# Patient Record
Sex: Male | Born: 1990
Health system: Southern US, Community
[De-identification: ages and names within clinical notes are randomized; demographics above are authoritative.]

## PROBLEM LIST (undated history)

## (undated) ENCOUNTER — Ambulatory Visit (HOSPITAL_COMMUNITY): Admission: EM | Payer: BC Managed Care – PPO | Source: Home / Self Care

## (undated) DIAGNOSIS — K219 Gastro-esophageal reflux disease without esophagitis: Secondary | ICD-10-CM

## (undated) DIAGNOSIS — R002 Palpitations: Secondary | ICD-10-CM

## (undated) DIAGNOSIS — R51 Headache: Secondary | ICD-10-CM

## (undated) DIAGNOSIS — R519 Headache, unspecified: Secondary | ICD-10-CM

## (undated) DIAGNOSIS — R079 Chest pain, unspecified: Secondary | ICD-10-CM

## (undated) DIAGNOSIS — R0602 Shortness of breath: Secondary | ICD-10-CM

## (undated) HISTORY — DX: Gastro-esophageal reflux disease without esophagitis: K21.9

## (undated) HISTORY — PX: NO PAST SURGERIES: SHX2092

## (undated) HISTORY — DX: Headache, unspecified: R51.9

## (undated) HISTORY — DX: Chest pain, unspecified: R07.9

## (undated) HISTORY — DX: Shortness of breath: R06.02

## (undated) HISTORY — DX: Headache: R51

## (undated) HISTORY — DX: Palpitations: R00.2

---
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
## 2009-02-13 ENCOUNTER — Emergency Department (HOSPITAL_COMMUNITY): Admission: EM | Admit: 2009-02-13 | Discharge: 2009-02-14 | Payer: Self-pay | Admitting: Emergency Medicine

## 2009-02-14 ENCOUNTER — Emergency Department (HOSPITAL_COMMUNITY): Admission: EM | Admit: 2009-02-14 | Discharge: 2009-02-14 | Payer: Self-pay | Admitting: Emergency Medicine

## 2009-02-19 ENCOUNTER — Emergency Department (HOSPITAL_COMMUNITY): Admission: EM | Admit: 2009-02-19 | Discharge: 2009-02-19 | Payer: Self-pay | Admitting: Family Medicine

## 2009-08-15 ENCOUNTER — Emergency Department (HOSPITAL_COMMUNITY): Admission: EM | Admit: 2009-08-15 | Discharge: 2009-08-15 | Payer: Self-pay | Admitting: Emergency Medicine

## 2009-12-08 ENCOUNTER — Emergency Department (HOSPITAL_COMMUNITY): Admission: EM | Admit: 2009-12-08 | Discharge: 2009-12-08 | Payer: Self-pay | Admitting: Family Medicine

## 2010-10-05 ENCOUNTER — Ambulatory Visit: Payer: Self-pay | Admitting: Internal Medicine

## 2010-10-12 ENCOUNTER — Ambulatory Visit: Payer: Self-pay | Admitting: Internal Medicine

## 2011-01-30 ENCOUNTER — Emergency Department (HOSPITAL_COMMUNITY)
Admission: EM | Admit: 2011-01-30 | Discharge: 2011-01-30 | Disposition: A | Discharge status: Home / Self Care | Payer: 59 | Attending: Emergency Medicine | Admitting: Emergency Medicine

## 2011-01-30 ENCOUNTER — Emergency Department (HOSPITAL_COMMUNITY): Payer: 59

## 2011-01-30 DIAGNOSIS — R51 Headache: Secondary | ICD-10-CM | POA: Insufficient documentation

## 2011-01-30 DIAGNOSIS — IMO0002 Reserved for concepts with insufficient information to code with codable children: Secondary | ICD-10-CM | POA: Insufficient documentation

## 2011-01-30 DIAGNOSIS — S0990XA Unspecified injury of head, initial encounter: Secondary | ICD-10-CM | POA: Insufficient documentation

## 2011-04-11 ENCOUNTER — Emergency Department (HOSPITAL_COMMUNITY)
Admission: EM | Admit: 2011-04-11 | Discharge: 2011-04-11 | Discharge status: Against Medical Advice | Payer: 59 | Attending: Emergency Medicine | Admitting: Emergency Medicine

## 2011-04-11 ENCOUNTER — Encounter: Payer: Self-pay | Admitting: *Deleted

## 2011-04-11 DIAGNOSIS — L988 Other specified disorders of the skin and subcutaneous tissue: Secondary | ICD-10-CM | POA: Insufficient documentation

## 2011-04-11 NOTE — ED Notes (Signed)
Patient states that he is here tonight for evaluation of skin irritation and bumps that began 5 months ago but have gotten worse over time. Patient states that he has been to the Dermatologist for treatment but without any relief. Patient has been diagnosed with Eczema but has not received any relief from the cream that the dermatologist has prescribed. Patient has small raised papules on his wrist and inner arms that he states cover most of his body. He states that the itching has become very intense and started to burn in the shower.

## 2011-04-12 ENCOUNTER — Encounter (HOSPITAL_COMMUNITY): Payer: Self-pay | Admitting: *Deleted

## 2011-04-12 ENCOUNTER — Emergency Department (HOSPITAL_COMMUNITY)
Admission: EM | Admit: 2011-04-12 | Discharge: 2011-04-12 | Disposition: A | Discharge status: Home / Self Care | Payer: 59 | Attending: Emergency Medicine | Admitting: Emergency Medicine

## 2011-04-12 DIAGNOSIS — F17200 Nicotine dependence, unspecified, uncomplicated: Secondary | ICD-10-CM | POA: Insufficient documentation

## 2011-04-12 DIAGNOSIS — R21 Rash and other nonspecific skin eruption: Secondary | ICD-10-CM | POA: Insufficient documentation

## 2011-04-12 MED ORDER — PREDNISONE (PAK) 10 MG PO TABS: ORAL_TABLET | ORAL | Status: AC

## 2011-04-12 MED ORDER — HYDROXYZINE HCL 25 MG PO TABS: 25.0000 mg | ORAL_TABLET | Freq: Four times a day (QID) | ORAL | Status: AC

## 2011-04-12 NOTE — Discharge Instructions (Signed)
Rash, Generic Many things can cause a rash. We are not certain what is causing the rash that you have. Some causes include infection, allergic reactions, medications, and chemicals. Sometimes something in your home that comes in contact with your skin may cause the rash. These include pets, new soaps, cosmetics, and foods. HOME CARE INSTRUCTIONS   Avoid extreme heat or cold, unless otherwise instructed. This can make the itching worse.   A cool bath or shower or a cool washcloth can sometimes ease the itching.   Avoid scratching. This can cause infection.   Take those medications prescribed by your caregiver.  SEEK IMMEDIATE MEDICAL CARE IF:  You develop increasing pain, swelling, or redness.   You develop a fever.   You develop new or severe symptoms such as body aches and pains, diarrhea, vomiting.   Your rash is not better in 3 days.  Document Released: 04/29/2002 Document Revised: 01/19/2011 Document Reviewed: 07/04/2008 [facility] Patient Information 2012 [place], [place].

## 2011-04-12 NOTE — ED Provider Notes (Signed)
Medical screening examination/treatment/procedure(s) were performed by non-physician practitioner and as supervising physician I was immediately available for consultation/collaboration.  Jasmine Awe, MD 04/12/11 2244

## 2011-04-12 NOTE — ED Notes (Signed)
Pt reports small raised red bumps on body, worse on back and thighs x5 months. Describes as very itchy. Was triaged yesterday but left due to wait. Has been seen here before for same. Has not f/u with dermatology.

## 2011-04-12 NOTE — ED Provider Notes (Signed)
History     CSN: [id number] Arrival date & time: 04/12/2011  6:16 PM  9:02 PM HPI Patient reports had a rash for 5 months. Uncertain if he has had a change in soaps, shampoos, lotions, laundry shampoo. No body that is close to him, ie girlfriend, family or friends, has similar rash. Reports rash is extremely pruritic. Denies fever, headache, neck pain, neck stiffness, purulent drainage. Patient is a 20 y.o. male presenting with rash. The history is provided by the patient.  Rash  Episode onset: 5 months ago. The problem has been gradually worsening. The problem is associated with an unknown factor. There has been no fever. The rash is present on the torso, left arm, right arm and abdomen. The pain is at a severity of 0/10. The patient is experiencing no pain. The pain has been constant since onset. Associated symptoms include itching. He has tried antihistamines for the symptoms. The treatment provided no relief.    History reviewed. No pertinent past medical history.  History reviewed. No pertinent past surgical history.  No family history on file.  History  Substance Use Topics  . Smoking status: Current Everyday Smoker -- 1.0 packs/day for 3 years    Types: Cigars  . Smokeless tobacco: Not on file  . Alcohol Use: Yes     occasionally      Review of Systems  Constitutional: Negative for fever and chills.  HENT: Negative for neck pain and neck stiffness.   Respiratory: Negative for cough and shortness of breath.   Skin: Positive for itching and rash. Negative for color change.  Neurological: Negative for headaches.    Allergies  Review of patient's allergies indicates no known allergies.  Home Medications  No current outpatient prescriptions on file.  BP 122/58  Pulse 59  Temp(Src) 98.3 F (36.8 C) (Oral)  Resp 18  Ht 5\' 7"  (1.702 m)  Wt 143 lb (64.864 kg)  BMI 22.40 kg/m2  SpO2 100%  Physical Exam  Constitutional: He is oriented to person, place, and time. He  appears well-developed and well-nourished.  HENT:  Head: Normocephalic and atraumatic.  Eyes: Pupils are equal, round, and reactive to light.  Neurological: He is alert and oriented to person, place, and time.  Skin: Skin is warm and dry. No rash noted. No erythema. No pallor.       Maculopapular rash on bilateral upper chimneys and lower extremities, entire torso and back. Rash is erythematous and dry.   Psychiatric: He has a normal mood and affect. His behavior is normal.    ED Course  Procedures    MDM          Thomasene Lot, [place] 04/12/11 2108

## 2011-04-26 ENCOUNTER — Encounter (HOSPITAL_COMMUNITY): Payer: Self-pay | Admitting: *Deleted

## 2011-04-26 ENCOUNTER — Emergency Department (HOSPITAL_COMMUNITY)
Admission: EM | Admit: 2011-04-26 | Discharge: 2011-04-26 | Disposition: A | Discharge status: Home / Self Care | Payer: 59 | Attending: Emergency Medicine | Admitting: Emergency Medicine

## 2011-04-26 DIAGNOSIS — R21 Rash and other nonspecific skin eruption: Secondary | ICD-10-CM

## 2011-04-26 DIAGNOSIS — L299 Pruritus, unspecified: Secondary | ICD-10-CM | POA: Insufficient documentation

## 2011-04-26 MED ORDER — TRIAMCINOLONE ACETONIDE 0.1 % EX CREA: TOPICAL_CREAM | Freq: Two times a day (BID) | CUTANEOUS | Status: AC

## 2011-04-26 MED ORDER — PERMETHRIN 5 % EX CREA: TOPICAL_CREAM | CUTANEOUS | Status: AC

## 2011-04-26 NOTE — ED Provider Notes (Signed)
History     CSN: [id number] Arrival date & time: 04/26/2011  3:29 PM   First MD Initiated Contact with Patient 04/26/11 1612      Chief Complaint  Patient presents with  . Rash    pt has rash all over body, states that he was prescribed prednisone which helped, but it ran out and the rash came back. pt has small red bumps noted to hands.     (Consider location/radiation/quality/duration/timing/severity/associated sxs/prior treatment) Patient is a 20 y.o. male presenting with rash. The history is provided by the patient.  Rash  This is a recurrent problem. Episode onset: about 7 months ago. The problem has been gradually worsening. The problem is associated with nothing. There has been no fever. The rash is present on the torso, left upper leg, right upper leg and left hand. The patient is experiencing no pain. Associated symptoms include itching. Pertinent negatives include no blisters, no pain and no weeping. He has tried steriods for the symptoms. The treatment provided significant relief.   Pt requesting a refill of the prednisone he was placed on previously.   Pt has not followed up since previous visit.   History reviewed. No pertinent past medical history.  History reviewed. No pertinent past surgical history.  History reviewed. No pertinent family history.  History  Substance Use Topics  . Smoking status: Former Smoker -- 1.0 packs/day for 3 years  . Smokeless tobacco: Not on file  . Alcohol Use: Yes     occasionally      Review of Systems  Constitutional: Negative.  Negative for fever.  Skin: Positive for itching and rash.  All other systems reviewed and are negative.    Allergies  Review of patient's allergies indicates no known allergies.  Home Medications   Current Outpatient Rx  Name Route Sig Dispense Refill  . HYDROXYZINE HCL 25 MG PO TABS Oral Take 25 mg by mouth every 6 (six) hours as needed.        BP 111/68  Pulse 79  Temp 98.5 F (36.9 C)   Resp 20  Wt 140 lb (63.504 kg)  SpO2 99%  Physical Exam  Constitutional: He appears well-developed and well-nourished.  HENT:  Head: Normocephalic and atraumatic.  Eyes: Pupils are equal, round, and reactive to light.  Skin: Rash noted.       Papular, thickened rash of the bilateral hands, trunk and bilateral thighs.  Non tender, no erythema, vesicles, warmth, drainage.      ED Course  Procedures (including critical care time)  Labs Reviewed - No data to display No results found.   No diagnosis found.    MDM  Pt questions possible scabies, will treat empirically, will have follow up with derm and place on triamcinolone.  Pt understands d/c instructions.         Achilles Dunk [place], [place] 04/26/11 1655

## 2011-04-27 NOTE — ED Provider Notes (Signed)
Medical screening examination/treatment/procedure(s) were performed by non-physician practitioner and as supervising physician I was immediately available for consultation/collaboration.  Raeford Razor, MD 04/27/11 [phone]

## 2014-10-02 ENCOUNTER — Emergency Department (INDEPENDENT_AMBULATORY_CARE_PROVIDER_SITE_OTHER): Payer: BLUE CROSS/BLUE SHIELD

## 2014-10-02 ENCOUNTER — Encounter (HOSPITAL_COMMUNITY): Payer: Self-pay | Admitting: Emergency Medicine

## 2014-10-02 ENCOUNTER — Emergency Department (HOSPITAL_COMMUNITY)
Admission: EM | Admit: 2014-10-02 | Discharge: 2014-10-02 | Disposition: A | Discharge status: Home / Self Care | Payer: BLUE CROSS/BLUE SHIELD | Source: Home / Self Care | Attending: Family Medicine | Admitting: Family Medicine

## 2014-10-02 DIAGNOSIS — M25511 Pain in right shoulder: Secondary | ICD-10-CM

## 2014-10-02 MED ORDER — DICLOFENAC SODIUM 75 MG PO TBEC: 75.0000 mg | DELAYED_RELEASE_TABLET | Freq: Two times a day (BID) | ORAL | Status: DC | PRN

## 2014-10-02 NOTE — Discharge Instructions (Signed)
Thank you for coming in today. [facility]  Do the exercises we talked about.  Take the voltaren twice daily for pain as needed.   Rotator Cuff Tendinitis  Rotator cuff tendinitis is inflammation of the tough, cord-like bands that connect muscle to bone (tendons) in your rotator cuff. Your rotator cuff is the collection of all the muscles and tendons that connect your arm to your shoulder. Your rotator cuff holds the head of your upper arm bone (humerus) in the cup (fossa) of your shoulder blade (scapula). CAUSES Rotator cuff tendinitis is usually caused by overusing the joint involved.  SIGNS AND SYMPTOMS  Deep ache in the shoulder also felt on the outside upper arm over the shoulder muscle.  Point tenderness over the area that is injured.  Pain comes on gradually and becomes worse with lifting the arm to the side (abduction) or turning it inward (internal rotation).  May lead to a chronic tear: When a rotator cuff tendon becomes inflamed, it runs the risk of losing its blood supply, causing some tendon fibers to die. This increases the risk that the tendon can fray and partially or completely tear. DIAGNOSIS Rotator cuff tendinitis is diagnosed by taking a medical history, performing a physical exam, and reviewing results of imaging exams. The medical history is useful to help determine the type of rotator cuff injury. The physical exam will include looking at the injured shoulder, feeling the injured area, and watching you do range-of-motion exercises. X-ray exams are typically done to rule out other causes of shoulder pain, such as fractures. MRI is the imaging exam usually used for significant shoulder injuries. Sometimes a dye study called CT arthrogram is done, but it is not as widely used as MRI. In some institutions, special ultrasound tests may also be used to aid in the diagnosis. TREATMENT  Less Severe Cases  Use of a sling to rest the shoulder for a  short period of time. Prolonged use of the sling can cause stiffness, weakness, and loss of motion of the shoulder joint.  Anti-inflammatory medicines, such as ibuprofen or naproxen sodium, may be prescribed. More Severe Cases  Physical therapy.  Use of steroid injections into the shoulder joint.  Surgery. HOME CARE INSTRUCTIONS   Use a sling or splint until the pain decreases. Prolonged use of the sling can cause stiffness, weakness, and loss of motion of the shoulder joint.  Apply ice to the injured area:  Put ice in a plastic bag.  Place a towel between your skin and the bag.  Leave the ice on for 20 minutes, 2-3 times a day.  Try to avoid use other than gentle range of motion while your shoulder is painful. Use the shoulder and exercise only as directed by your health care provider. Stop exercises or range of motion if pain or discomfort increases, unless directed otherwise by your health care provider.  Only take over-the-counter or prescription medicines for pain, discomfort, or fever as directed by your health care provider.  If you were given a shoulder sling and straps (immobilizer), do not remove it except as directed, or until you see a health care provider for a follow-up exam. If you need to remove it, move your arm as little as possible or as directed.  You may want to sleep on several pillows at night to lessen swelling and pain. SEEK IMMEDIATE MEDICAL CARE IF:   Your shoulder pain increases or new pain develops in your arm, hand, or fingers  and is not relieved with medicines.  You have new, unexplained symptoms, especially increased numbness in the hands or loss of strength.  You develop any worsening of the problems that brought you in for care.  Your arm, hand, or fingers are numb or tingling.  Your arm, hand, or fingers are swollen, painful, or turn white or blue. MAKE SURE YOU:  Understand these instructions.  Will watch your condition.  Will get help  right away if you are not doing well or get worse. Document Released: 07/30/2003 Document Revised: 02/27/2013 Document Reviewed: 12/19/2012 [facility] Patient Information 2015 [place]. This information is not intended to replace advice given to you by your health care provider. Make sure you discuss any questions you have with your health care provider.

## 2014-10-02 NOTE — ED Provider Notes (Signed)
Calvin Larsen is a 24 y.o. male who presents to Urgent Care today for right shoulder pain. Patient has right anterior shoulder pain present off and on for the last 3 years. He denies any specific injury. He notes the pain worsened yesterday when he was attempting to externally rotate her shoulder against force. The plastic that he was pulling jerked resulted in shoulder pain. The pain is located at the anterior aspect of her shoulder. He notes some pain with crossover and with abduction. He denies any radiating pain weakness or numbness. He notes that he was unable to continue lifting weights due to the pain a year or several months ago. The pain is worse especially with military press type exercises. He denies any neck pain.   History reviewed. No pertinent past medical history. History reviewed. No pertinent past surgical history. History  Substance Use Topics  . Smoking status: Former Smoker -- 1.00 packs/day for 3 years  . Smokeless tobacco: Not on file  . Alcohol Use: Yes     Comment: occasionally   ROS as above Medications: No current facility-administered medications for this encounter.   Current Outpatient Prescriptions  Medication Sig Dispense Refill  . diclofenac (VOLTAREN) 75 MG EC tablet Take 1 tablet (75 mg total) by mouth 2 (two) times daily as needed. 30 tablet 0  . hydrOXYzine (ATARAX/VISTARIL) 25 MG tablet Take 25 mg by mouth every 6 (six) hours as needed.       No Known Allergies   Exam:  Pulse 61  Temp(Src) 98.1 F (36.7 C) (Oral)  Resp 14  SpO2 100% Gen: Well NAD HEENT: EOMI,  MMM Lungs: Normal work of breathing. CTABL Heart: RRR no MRG Abd: NABS, Soft. Nondistended, Nontender Exts: Brisk capillary refill, warm and well perfused.  C-spine: Nontender normal motion negative Spurling's test Right shoulder normal-appearing nontender. Full range of motion. Mild pain with abduction arc at about 70 Negative Hawkins and Neer's test. Positive crossover arm  compression test. Positive empty can test. Pain with anterior apprehension test and improvement with relocation test.   strength is 5/5 in abduction external and internal rotation Distally pulses capillary refill sensation are intact.  Left shoulder normal-appearing nontender full range of motion negative impingement testing normal strength pulses capillary refill and sensation intact distally  No results found for this or any previous visit (from the past 24 hour(s)). Dg Shoulder Right  10/02/2014   CLINICAL DATA:  Right shoulder pain. Pain with any movement. No known injury.  EXAM: RIGHT SHOULDER - 2+ VIEW  COMPARISON:  None.  FINDINGS: No fracture or dislocation. Joint spaces are preserved. Acromioclavicular and glenohumeral joint spaces appear preserved given obliquity. No evidence of calcific tendinitis. Regional soft tissues appear normal. Limited visualization of the adjacent thorax is normal.  IMPRESSION: Normal radiographs of the right shoulder.   Electronically Signed   By: Simonne ComeJohn  Watts M.D.   On: 10/02/2014 10:05    Assessment and Plan: 24 y.o. male with right shoulder pain acute on chronic. I suspect an anterior labrum injury based on the chronicity of the pain with positive empty can and cross over arm test as well as positive apprehension test. Plan for shoulder RTC exercises and follow up with orthopedics for further evaluation and treatment.   Discussed warning signs or symptoms. Please see discharge instructions. Patient expresses understanding.     Rodolph BongEvan S Ilya Ess, MD 10/02/14 1014

## 2014-10-02 NOTE — ED Notes (Signed)
C/o right shoulder injury for three years now States yesterday he did jerk some plastic while at work States he works at [employer] Tylenol and ice was used as tx

## 2015-10-24 ENCOUNTER — Emergency Department (HOSPITAL_COMMUNITY)
Admission: EM | Admit: 2015-10-24 | Discharge: 2015-10-25 | Disposition: A | Discharge status: Home / Self Care | Payer: PRIVATE HEALTH INSURANCE | Attending: Emergency Medicine | Admitting: Emergency Medicine

## 2015-10-24 DIAGNOSIS — H9202 Otalgia, left ear: Secondary | ICD-10-CM | POA: Diagnosis present

## 2015-10-24 DIAGNOSIS — H6122 Impacted cerumen, left ear: Secondary | ICD-10-CM | POA: Insufficient documentation

## 2015-10-24 DIAGNOSIS — Z87891 Personal history of nicotine dependence: Secondary | ICD-10-CM | POA: Diagnosis not present

## 2015-10-24 DIAGNOSIS — Z791 Long term (current) use of non-steroidal anti-inflammatories (NSAID): Secondary | ICD-10-CM | POA: Insufficient documentation

## 2015-10-25 ENCOUNTER — Encounter (HOSPITAL_COMMUNITY): Payer: Self-pay | Admitting: Emergency Medicine

## 2015-10-25 MED ORDER — HYDROGEN PEROXIDE 3 % EX SOLN
Freq: Once | CUTANEOUS | Status: AC
  Administered 2015-10-25: 04:00:00 via TOPICAL
  Filled 2015-10-25: qty 473

## 2015-10-25 NOTE — Discharge Instructions (Signed)
Cerumen Impaction The structures of the external ear canal secrete a waxy substance known as cerumen. Excess cerumen can build up in the ear canal, causing a condition known as cerumen impaction. Cerumen impaction can cause ear pain and disrupt the function of the ear. The rate of cerumen production differs for each individual. In certain individuals, the configuration of the ear canal may decrease his or her ability to naturally remove cerumen. CAUSES Cerumen impaction is caused by excessive cerumen production or buildup. RISK FACTORS  Frequent use of swabs to clean ears.  Having narrow ear canals.  Having eczema.  Being dehydrated. SIGNS AND SYMPTOMS  Diminished hearing.  Ear drainage.  Ear pain.  Ear itch. TREATMENT Treatment may involve:  Over-the-counter or prescription ear drops to soften the cerumen.  Removal of cerumen by a health care provider. This may be done with:  Irrigation with warm water. This is the most common method of removal.  Ear curettes and other instruments.  Surgery. This may be done in severe cases. HOME CARE INSTRUCTIONS  Take medicines only as directed by your health care provider.  Do not insert objects into the ear with the intent of cleaning the ear. PREVENTION  Do not insert objects into the ear, even with the intent of cleaning the ear. Removing cerumen as a part of normal hygiene is not necessary, and the use of swabs in the ear canal is not recommended.  Drink enough water to keep your urine clear or pale yellow.  Control your eczema if you have it. SEEK MEDICAL CARE IF:  You develop ear pain.  You develop bleeding from the ear.  The cerumen does not clear after you use ear drops as directed.   This information is not intended to replace advice given to you by your health care provider. Make sure you discuss any questions you have with your health care provider.   Document Released: 06/16/2004 Document Revised: 05/30/2014  Document Reviewed: 12/24/2014 Elsevier Interactive Patient Education 2016 [employer]  

## 2015-10-25 NOTE — ED Provider Notes (Signed)
CSN: [id number]     Arrival date & time 10/24/15  2342 History  By signing my name below, I, Phillis HaggisGabriella Gaje, attest that this documentation has been prepared under the direction and in the presence of Zadie Rhineonald Fuller Makin, MD. Electronically Signed: Phillis HaggisGabriella Gaje, ED Scribe. 10/25/2015. 3:53 AM.  Chief Complaint  Patient presents with  . Otalgia   Patient is a 25 y.o. male presenting with ear pain. The history is provided by the patient. No language interpreter was used.  Otalgia Location:  Left Quality:  Aching Severity:  Mild Onset quality:  Gradual Duration:  1 day Timing:  Constant Progression:  Worsening Chronicity:  New Ineffective treatments:  None tried Associated symptoms: hearing loss   Associated symptoms: no ear discharge and no fever   HPI Comments: Calvin Larsen is a 25 y.o. male who presents to the Emergency Department complaining of left otalgia onset one day ago. Pt reports associated hearing loss to the left ear. He has used a Q-Tip in the ear to no relief. He has not tried anything for his pain. He denies discharge from the ear.  PMH - none Social History  Substance Use Topics  . Smoking status: Former Smoker -- 1.00 packs/day for 3 years  . Smokeless tobacco: None  . Alcohol Use: Yes     Comment: occasionally    Review of Systems  Constitutional: Negative for fever.  HENT: Positive for ear pain and hearing loss. Negative for ear discharge.    Allergies  Review of patient's allergies indicates no known allergies.  Home Medications   Prior to Admission medications   Medication Sig Start Date End Date Taking? Authorizing Provider  diclofenac (VOLTAREN) 75 MG EC tablet Take 1 tablet (75 mg total) by mouth 2 (two) times daily as needed. 10/02/14   Rodolph BongEvan S Corey, MD  hydrOXYzine (ATARAX/VISTARIL) 25 MG tablet Take 25 mg by mouth every 6 (six) hours as needed.      Historical Provider, MD   BP 112/84 mmHg  Pulse 60  Temp(Src) 97.7 F (36.5 C) (Oral)  Resp 16   Ht 5\' 7"  (1.702 m)  Wt 140 lb (63.504 kg)  BMI 21.92 kg/m2  SpO2 100% Physical Exam CONSTITUTIONAL: Well developed/well nourished HEAD: Normocephalic/atraumatic EYES: EOMI/PERRL ENMT: Mucous membranes moist; right TM intact; left TM obscured by cerumen NECK: supple no meningeal signs CV: S1/S2 noted, no murmurs/rubs/gallops noted LUNGS: Lungs are clear to auscultation bilaterally, no apparent distress NEURO: Pt is awake/alert/appropriate, moves all extremitiesx4.  No facial droop.   SKIN: warm, color normal PSYCH: no abnormalities of mood noted, alert and oriented to situation  ED Course  Procedures  DIAGNOSTIC STUDIES: Oxygen Saturation is 100% on RA, normal by my interpretation.    COORDINATION OF CARE: 3:53 AM-Discussed treatment plan which includes removal of cerumen with pt at bedside and pt agreed to plan.   Left ear flushed by nursing with cerumen removal Pt tolerated well He has mild erythema to left TM.  He has signs of irritation to canal, but no signs of otitis externa Will d/c home   MDM   Final diagnoses:  Cerumen impaction, left    Nursing notes including past medical history and social history reviewed and considered in documentation  I personally performed the services described in this documentation, which was scribed in my presence. The recorded information has been reviewed and is accurate.       Zadie Rhineonald Jessina Marse, MD 10/25/15 [phone]

## 2015-10-25 NOTE — ED Notes (Signed)
Pt from home with complaints of left ear pain and ear wax build up that has been causing pain since Sunday

## 2015-12-02 ENCOUNTER — Ambulatory Visit (INDEPENDENT_AMBULATORY_CARE_PROVIDER_SITE_OTHER): Payer: PRIVATE HEALTH INSURANCE

## 2015-12-02 ENCOUNTER — Ambulatory Visit (INDEPENDENT_AMBULATORY_CARE_PROVIDER_SITE_OTHER): Payer: PRIVATE HEALTH INSURANCE | Admitting: Family Medicine

## 2015-12-02 VITALS — BP 110/76 | HR 54 | Temp 98.1°F | Resp 16 | Ht 68.0 in | Wt 144.0 lb

## 2015-12-02 DIAGNOSIS — R0789 Other chest pain: Secondary | ICD-10-CM

## 2015-12-02 NOTE — Progress Notes (Signed)
   HPI  Patient presents today here with L sided chest pain  Pt explains that he has had a L sided achy type pain starting this morning after stretching a certain way. He also has slight pain with deep inspiration. He denies dyspnea, cough, palpitations, radiation, or sweating associated.   He has had similar pain previously that went away spontaneously.   He denies Family Hx of heart disease He smokes socially on the weekend.  He does physical work lifting, pulling, and pushing routiney  PMH: Smoking status noted ROS: Per HPI  Objective: BP 110/76 mmHg  Pulse 54  Temp(Src) 98.1 F (36.7 C) (Oral)  Resp 16  Ht 5\' 8"  (1.727 m)  Wt 144 lb (65.318 kg)  BMI 21.90 kg/m2  SpO2 100% Gen: NAD, alert, cooperative with exam HEENT: NCAT CV: RRR, good S1/S2, no murmur Chest wall- No tenderness to palp of costosternal border Resp: CTABL, no wheezes, non-labored Ext: No edema, warm Neuro: Alert and oriented, No gross deficits   EKG- NSR CXR- WNL  Assessment and plan:  # Atypical chest pain Very low risk for heart disease, EKG normal today Positional pain and with deep inspiration but different character than pleurisy.  Reassurance, treat winth NSAIDs and Ice  Red flags reviewed, RTC as needed.     Orders Placed This Encounter  Procedures  . DG Chest 2 View    Standing Status: Future     Number of Occurrences: 1     Standing Expiration Date: 02/01/2017    Order Specific Question:  Reason for Exam (SYMPTOM  OR DIAGNOSIS REQUIRED)    Answer:  L sided chest pain with inspiration    Order Specific Question:  Preferred imaging location?    Answer:  External  . EKG 12-Lead    Kevin FentonSamuel Fidel Caggiano, MD 9:43 AM

## 2015-12-02 NOTE — Patient Instructions (Addendum)
Great to meet you!  Nonspecific Chest Pain  Chest pain can be caused by many different conditions. There is always a chance that your pain could be related to something serious, such as a heart attack or a blood clot in your lungs. Chest pain can also be caused by conditions that are not life-threatening. If you have chest pain, it is very important to follow up with your health care provider. CAUSES  Chest pain can be caused by:  Heartburn.  Pneumonia or bronchitis.  Anxiety or stress.  Inflammation around your heart (pericarditis) or lung (pleuritis or pleurisy).  A blood clot in your lung.  A collapsed lung (pneumothorax). It can develop suddenly on its own (spontaneous pneumothorax) or from trauma to the chest.  Shingles infection (varicella-zoster virus).  Heart attack.  Damage to the bones, muscles, and cartilage that make up your chest wall. This can include:  Bruised bones due to injury.  Strained muscles or cartilage due to frequent or repeated coughing or overwork.  Fracture to one or more ribs.  Sore cartilage due to inflammation (costochondritis). RISK FACTORS  Risk factors for chest pain may include:  Activities that increase your risk for trauma or injury to your chest.  Respiratory infections or conditions that cause frequent coughing.  Medical conditions or overeating that can cause heartburn.  Heart disease or family history of heart disease.  Conditions or health behaviors that increase your risk of developing a blood clot.  Having had chicken pox (varicella zoster). SIGNS AND SYMPTOMS Chest pain can feel like:  Burning or tingling on the surface of your chest or deep in your chest.  Crushing, pressure, aching, or squeezing pain.  Dull or sharp pain that is worse when you move, cough, or take a deep breath.  Pain that is also felt in your back, neck, shoulder, or arm, or pain that spreads to any of these areas. Your chest pain may come and go,  or it may stay constant. DIAGNOSIS Lab tests or other studies may be needed to find the cause of your pain. Your health care provider may have you take a test called an ambulatory ECG (electrocardiogram). An ECG records your heartbeat patterns at the time the test is performed. You may also have other tests, such as:  Transthoracic echocardiogram (TTE). During echocardiography, sound waves are used to create a picture of all of the heart structures and to look at how blood flows through your heart.  Transesophageal echocardiogram (TEE).This is a more advanced imaging test that obtains images from inside your body. It allows your health care provider to see your heart in finer detail.  Cardiac monitoring. This allows your health care provider to monitor your heart rate and rhythm in real time.  Holter monitor. This is a portable device that records your heartbeat and can help to diagnose abnormal heartbeats. It allows your health care provider to track your heart activity for several days, if needed.  Stress tests. These can be done through exercise or by taking medicine that makes your heart beat more quickly.  Blood tests.  Imaging tests. TREATMENT  Your treatment depends on what is causing your chest pain. Treatment may include:  Medicines. These may include:  Acid blockers for heartburn.  Anti-inflammatory medicine.  Pain medicine for inflammatory conditions.  Antibiotic medicine, if an infection is present.  Medicines to dissolve blood clots.  Medicines to treat coronary artery disease.  Supportive care for conditions that do not require medicines. This may include:  Resting.  Applying heat or cold packs to injured areas.  Limiting activities until pain decreases. HOME CARE INSTRUCTIONS  If you were prescribed an antibiotic medicine, finish it all even if you start to feel better.  Avoid any activities that bring on chest pain.  Do not use any tobacco products,  including cigarettes, chewing tobacco, or electronic cigarettes. If you need help quitting, ask your health care provider.  Do not drink alcohol.  Take medicines only as directed by your health care provider.  Keep all follow-up visits as directed by your health care provider. This is important. This includes any further testing if your chest pain does not go away.  If heartburn is the cause for your chest pain, you may be told to keep your head raised (elevated) while sleeping. This reduces the chance that acid will go from your stomach into your esophagus.  Make lifestyle changes as directed by your health care provider. These may include:  Getting regular exercise. Ask your health care provider to suggest some activities that are safe for you.  Eating a heart-healthy diet. A registered dietitian can help you to learn healthy eating options.  Maintaining a healthy weight.  Managing diabetes, if necessary.  Reducing stress. SEEK MEDICAL CARE IF:  Your chest pain does not go away after treatment.  You have a rash with blisters on your chest.  You have a fever. SEEK IMMEDIATE MEDICAL CARE IF:   Your chest pain is worse.  You have an increasing cough, or you cough up blood.  You have severe abdominal pain.  You have severe weakness.  You faint.  You have chills.  You have sudden, unexplained chest discomfort.  You have sudden, unexplained discomfort in your arms, back, neck, or jaw.  You have shortness of breath at any time.  You suddenly start to sweat, or your skin gets clammy.  You feel nauseous or you vomit.  You suddenly feel light-headed or dizzy.  Your heart begins to beat quickly, or it feels like it is skipping beats. These symptoms may represent a serious problem that is an emergency. Do not wait to see if the symptoms will go away. Get medical help right away. Call your local emergency services (911 in the [place] Do not drive yourself to the hospital.    This information is not intended to replace advice given to you by your health care provider. Make sure you discuss any questions you have with your health care provider.   Document Released: 02/16/2005 Document Revised: 05/30/2014 Document Reviewed: 12/13/2013 Elsevier Interactive Patient Education 2016 [employer]     IF you received an x-ray today, you will receive an invoice from [facility]. Please contact [facility] at [phone] with questions or concerns regarding your invoice.   IF you received labwork today, you will receive an invoice from [facility]. Please contact [facility] at [phone] with questions or concerns regarding your invoice.   Our billing staff will not be able to assist you with questions regarding bills from these companies.  You will be contacted with the lab results as soon as they are available. The fastest way to get your results is to activate your My Chart account. Instructions are located on the last page of this paperwork. If you have not heard from us regarding the results in 2 weeks, please contact this office.

## 2015-12-22 ENCOUNTER — Emergency Department (HOSPITAL_COMMUNITY)
Admission: EM | Admit: 2015-12-22 | Discharge: 2015-12-22 | Disposition: A | Discharge status: Home / Self Care | Payer: PRIVATE HEALTH INSURANCE | Attending: Emergency Medicine | Admitting: Emergency Medicine

## 2015-12-22 DIAGNOSIS — Z87891 Personal history of nicotine dependence: Secondary | ICD-10-CM | POA: Diagnosis not present

## 2015-12-22 DIAGNOSIS — R42 Dizziness and giddiness: Secondary | ICD-10-CM | POA: Diagnosis present

## 2015-12-22 LAB — URINE MICROSCOPIC-ADD ON
Bacteria, UA: NONE SEEN
Squamous Epithelial / LPF: NONE SEEN
WBC, UA: NONE SEEN WBC/hpf (ref 0–5)

## 2015-12-22 LAB — CBC
HCT: 39.8 % (ref 39.0–52.0)
Hemoglobin: 13.3 g/dL (ref 13.0–17.0)
MCH: 28.9 pg (ref 26.0–34.0)
MCHC: 33.4 g/dL (ref 30.0–36.0)
MCV: 86.5 fL (ref 78.0–100.0)
PLATELETS: 218 10*3/uL (ref 150–400)
RBC: 4.6 MIL/uL (ref 4.22–5.81)
RDW: 13.1 % (ref 11.5–15.5)
WBC: 7 10*3/uL (ref 4.0–10.5)

## 2015-12-22 LAB — BASIC METABOLIC PANEL
Anion gap: 7 (ref 5–15)
BUN: 16 mg/dL (ref 6–20)
CALCIUM: 9.3 mg/dL (ref 8.9–10.3)
CO2: 28 mmol/L (ref 22–32)
CREATININE: 1.13 mg/dL (ref 0.61–1.24)
Chloride: 103 mmol/L (ref 101–111)
GFR calc non Af Amer: >60 mL/min (ref >60)
Glucose, Bld: 116 mg/dL — ABNORMAL HIGH (ref 65–99)
Potassium: 3.2 mmol/L — ABNORMAL LOW (ref 3.5–5.1)
Sodium: 138 mmol/L (ref 135–145)

## 2015-12-22 LAB — URINALYSIS, ROUTINE W REFLEX MICROSCOPIC
BILIRUBIN URINE: NEGATIVE
Glucose, UA: NEGATIVE mg/dL
KETONES UR: NEGATIVE mg/dL
Leukocytes, UA: NEGATIVE
Nitrite: NEGATIVE
PROTEIN: NEGATIVE mg/dL
Specific Gravity, Urine: 1.021 (ref 1.005–1.030)
pH: 6.5 (ref 5.0–8.0)

## 2015-12-22 LAB — CBG MONITORING, ED: GLUCOSE-CAPILLARY: 90 mg/dL (ref 65–99)

## 2015-12-22 MED ORDER — SODIUM CHLORIDE 0.9 % IV BOLUS (SEPSIS)
1000.0000 mL | Freq: Once | INTRAVENOUS | Status: AC
  Administered 2015-12-22: 1000 mL via INTRAVENOUS

## 2015-12-22 NOTE — ED Notes (Signed)
Patient was alert, oriented and stable upon discharge. RN went over AVS and patient had no further questions.  

## 2015-12-22 NOTE — ED Triage Notes (Signed)
Pt states that he was at work 1.5 ours ago and started feeling dizzy w/ a headache and having tingling in bilateral legs. Denies LOC. Alert and oriented.

## 2015-12-22 NOTE — ED Notes (Signed)
ED PA at bedside

## 2015-12-22 NOTE — ED Provider Notes (Signed)
[facility]-EMERGENCY DEPT Provider Note   CSN: [id number] Arrival date & time: 12/22/15  1527  First Provider Contact:  First MD Initiated Contact with Patient 12/22/15 1607        History   Chief Complaint Chief Complaint  Patient presents with  . Dizziness    HPI Calvin Larsen is a 25 y.o. male.  Calvin Larsen is a 25 y.o. Male who presents to the ED via EMS after feeling lightheaded while working today. 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 Patient reports today while working he began to feel lightheaded and felt like his breathing was "off." He tells me he went to his manager's office where he sat down and they called EMS. He reports he began feeling better while sitting down and when he stood up to greet the paramedics he began feeling lightheaded again. He reports currently he feels back to normal. He denies feeling lightheaded with position change. He denies any chest pain or shortness of breath. Patient denies fevers, leg pain, leg swelling, syncope, current headache, numbness, tingling, weakness, changes to his vision, neck pain, abdominal pain, nausea, vomiting, chest pain, shortness of breath or coughing.   The history is provided by the patient. No language interpreter was used.  Dizziness   Pertinent negatives include no numbness, no nausea, no vomiting and no weakness.    No past medical history on file.  There are no active problems to display for this patient.   No past surgical history on file.     Home Medications    Prior to Admission medications   Not on File    Family History No family history on file.  Social History Social History  Substance Use Topics  . Smoking status: Former Smoker    Packs/day: 1.00    Years: 3.00  . Smokeless tobacco: Never Used  . Alcohol use 0.0 oz/week     Comment: occasionally     Allergies   Review of patient's allergies indicates no known allergies.   Review of  Systems Review of Systems  Constitutional: Negative for chills and fever.  HENT: Negative for congestion and sore throat.   Eyes: Negative for visual disturbance.  Respiratory: Negative for cough and shortness of breath.   Cardiovascular: Negative for chest pain.  Gastrointestinal: Negative for abdominal pain, diarrhea, nausea and vomiting.  Genitourinary: Negative for dysuria and frequency.  Musculoskeletal: Negative for back pain and neck pain.  Skin: Negative for rash.  Neurological: Positive for light-headedness and headaches (Resolved.). Negative for dizziness, syncope, speech difficulty, weakness and numbness.     Physical Exam Updated Vital Signs BP 113/70 (BP Location: Right Arm)   Pulse (!) 50   Temp 98 F (36.7 C) (Oral)   Resp 16   Ht 5\' 7"  (1.702 m)   Wt 63.5 kg   SpO2 100%   BMI 21.93 kg/m   Physical Exam  Constitutional: He is oriented to person, place, and time. He appears well-developed and well-nourished. No distress.  Nontoxic appearing.   HENT:  Head: Normocephalic and atraumatic.  Right Ear: External ear normal.  Left Ear: External ear normal.  Mouth/Throat: Oropharynx is clear and moist.  Bilateral tympanic membranes are pearly-gray without erythema or loss of landmarks.   Eyes: Conjunctivae and EOM are normal. Pupils are equal, round, and reactive to light. Right eye exhibits no discharge. Left eye exhibits no discharge.  Neck: Normal range of motion. Neck supple. No JVD present. No  tracheal deviation present.  Cardiovascular: Normal rate, regular rhythm, normal heart sounds and intact distal pulses.  Exam reveals no gallop and no friction rub.   No murmur heard. Bilateral radial, posterior tibialis and dorsalis pedis pulses are intact.    Pulmonary/Chest: Effort normal and breath sounds normal. No stridor. No respiratory distress. He has no wheezes. He has no rales.  Lungs clear to auscultation bilaterally. No increased work of breathing.  Abdominal:  Soft. There is no tenderness. There is no guarding.  Musculoskeletal: He exhibits no edema or tenderness.  No lower extremity edema or tenderness. Good strength in his bilateral upper and lower extremities.  Lymphadenopathy:    He has no cervical adenopathy.  Neurological: He is alert and oriented to person, place, and time. No cranial nerve deficit. Coordination normal.  Patient is alert and oriented 3. Cranial nerves are intact. Speech is clear and coherent. No pronator drift. Finger to nose intact bilaterally.  Normal gait. Sensation is intact to his bilateral upper and lower extremities.   Skin: Skin is warm and dry. Capillary refill takes less than 2 seconds. No rash noted. He is not diaphoretic. No erythema. No pallor.  Psychiatric: He has a normal mood and affect. His behavior is normal.  Nursing note and vitals reviewed.    ED Treatments / Results  Labs (all labs ordered are listed, but only abnormal results are displayed) Labs Reviewed  BASIC METABOLIC PANEL - Abnormal; Notable for the following:       Result Value   Potassium 3.2 (*)    Glucose, Bld 116 (*)    All other components within normal limits  URINALYSIS, ROUTINE W REFLEX MICROSCOPIC (NOT AT [facility]) - Abnormal; Notable for the following:    Hgb urine dipstick TRACE (*)    All other components within normal limits  CBC  URINE MICROSCOPIC-ADD ON  CBG MONITORING, ED    EKG  EKG Interpretation  Date/Time:  Tuesday December 22 2015 15:50:06 EDT Ventricular Rate:  59 PR Interval:    QRS Duration: 111 QT Interval:  407 QTC Calculation: 404 R Axis:   88 Text Interpretation:  Sinus rhythm Probable left atrial enlargement Left ventricular hypertrophy No previous ECGs available Confirmed by Manus Gunning  MD, STEPHEN ([id number]) on 12/22/2015 4:59:44 PM       Radiology No results found.  Procedures Procedures (including critical care time)  Medications Ordered in ED Medications  sodium chloride 0.9 % bolus 1,000 mL  (1,000 mLs Intravenous New Bag/Given 12/22/15 1630)     Initial Impression / Assessment and Plan / ED Course  I have reviewed the triage vital signs and the nursing notes.  Pertinent labs & imaging results that were available during my care of the patient were reviewed by me and considered in my medical decision making (see chart for details).  Clinical Course   This is a 25 y.o. Male who presents to the ED via EMS after feeling lightheaded while working today. 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 Patient reports today while working he began to feel lightheaded and felt like his breathing was "off." He tells me he went to his manager's office where he sat down and they called EMS. He reports he began feeling better while sitting down and when he stood up to greet the paramedics he began feeling lightheaded again. He reports currently he feels back to normal. He denies feeling lightheaded with position change. He denies any chest pain  or shortness of breath.  On exam the patient is afebrile nontoxic-appearing. He has no focal neurological deficits. He ambulates without feeling lightheaded or dizzy. His abdomen is soft and nontender. BMP sore along for a potassium of 3.2. CBC is within normal limits. CBG is 90. Urinalysis shows only trace hemoglobin. Patient without urinary symptoms. He is without flank pain. Orthostatics are negative. Patient is ambulated several times with normal gait and without symptoms. He is remained symptom-free in the emergency department. Will discharge with close follow-up by primary care. I encouraged him to drink 6-8 glasses of water a day. I suspect the patient may have been dehydrated while working vigorously and this causes him to feel positionally lightheaded. I discussed strict and specific return precautions. I advised the patient to follow-up with their primary care provider this week. I advised the patient to return to the  emergency department with new or worsening symptoms or new concerns. The patient verbalized understanding and agreement with plan.     Final Clinical Impressions(s) / ED Diagnoses   Final diagnoses:  Lightheadedness    New Prescriptions New Prescriptions   No medications on file     Everlene Farrier, PA-C 12/22/15 1850    Glynn Octave, MD 12/23/15 [phone]

## 2016-01-03 ENCOUNTER — Encounter (HOSPITAL_COMMUNITY): Payer: Self-pay

## 2016-01-03 ENCOUNTER — Emergency Department (HOSPITAL_COMMUNITY)
Admission: EM | Admit: 2016-01-03 | Discharge: 2016-01-03 | Disposition: A | Discharge status: Home / Self Care | Payer: PRIVATE HEALTH INSURANCE | Attending: Emergency Medicine | Admitting: Emergency Medicine

## 2016-01-03 DIAGNOSIS — Z87891 Personal history of nicotine dependence: Secondary | ICD-10-CM | POA: Diagnosis not present

## 2016-01-03 DIAGNOSIS — R42 Dizziness and giddiness: Secondary | ICD-10-CM | POA: Diagnosis present

## 2016-01-03 DIAGNOSIS — R0602 Shortness of breath: Secondary | ICD-10-CM | POA: Diagnosis not present

## 2016-01-03 DIAGNOSIS — R55 Syncope and collapse: Secondary | ICD-10-CM | POA: Insufficient documentation

## 2016-01-03 LAB — CBC
HCT: 39.5 % (ref 39.0–52.0)
Hemoglobin: 13.4 g/dL (ref 13.0–17.0)
MCH: 28.8 pg (ref 26.0–34.0)
MCHC: 33.9 g/dL (ref 30.0–36.0)
MCV: 84.8 fL (ref 78.0–100.0)
PLATELETS: 240 10*3/uL (ref 150–400)
RBC: 4.66 MIL/uL (ref 4.22–5.81)
RDW: 12.8 % (ref 11.5–15.5)
WBC: 5.7 10*3/uL (ref 4.0–10.5)

## 2016-01-03 LAB — BASIC METABOLIC PANEL
Anion gap: 11 (ref 5–15)
BUN: 16 mg/dL (ref 6–20)
CALCIUM: 9.7 mg/dL (ref 8.9–10.3)
CO2: 24 mmol/L (ref 22–32)
CREATININE: 1.02 mg/dL (ref 0.61–1.24)
Chloride: 103 mmol/L (ref 101–111)
GFR calc non Af Amer: >60 mL/min (ref >60)
Glucose, Bld: 104 mg/dL — ABNORMAL HIGH (ref 65–99)
Potassium: 3.3 mmol/L — ABNORMAL LOW (ref 3.5–5.1)
SODIUM: 138 mmol/L (ref 135–145)

## 2016-01-03 LAB — CBG MONITORING, ED: GLUCOSE-CAPILLARY: 120 mg/dL — AB (ref 65–99)

## 2016-01-03 NOTE — ED Notes (Signed)
Pt has urinal at bedside 

## 2016-01-03 NOTE — Discharge Instructions (Signed)
Call the cardiology clinic if your symptoms persist to discuss the possibility of a Holter or event monitor.  Return to the emergency department if your symptoms significantly worsen or change.

## 2016-01-03 NOTE — ED Triage Notes (Signed)
Pt states that approx 2 hours ago, he began to feel lightheaded and dizzy. Pt also states he feels short of breath during these episodes. Pt states that he was seen here 2 weeks ago for dehydration, but has since been increasing his fluid intake. Pt is able to speak in complete sentences in triage. A&Ox4.

## 2016-01-03 NOTE — ED Provider Notes (Signed)
[facility]-EMERGENCY DEPT Provider Note   CSN: [id number] Arrival date & time: 01/03/16  1932  By signing my name below, I, Phillis HaggisGabriella Gaje, attest that this documentation has been prepared under the direction and in the presence of Geoffery Lyonsouglas Fredi Geiler, MD. Electronically Signed: Phillis HaggisGabriella Gaje, ED Scribe. 01/03/16. 11:15 PM.  First Provider Contact:  None    History   Chief Complaint Chief Complaint  Patient presents with  . Dizziness   The history is provided by the patient. No language interpreter was used.   HPI Comments: Calvin Larsen is a 25 y.o. male who presents to the Emergency Department complaining of sudden onset, intermittent dizziness onset 6 hours ago. Pt reports associated lightheadedness, increased heart rate, and SOB. He reports that it feels like he cannot catch his breath during these episodes. Pt states that he was seen 2 weeks ago and diagnosed with dehydration, but has increased his fluid intake since then. He notices that the episodes have occurred after eating. He was not doing any strenuous activities when the symptoms started. He denies hx of asthma. He denies new life stressors, appetite change, hearing loss, tinnitus, or wheezing. Pt does not have a PCP.   History reviewed. No pertinent past medical history.  There are no active problems to display for this patient.   History reviewed. No pertinent surgical history.   Home Medications    Prior to Admission medications   Not on File    Family History History reviewed. No pertinent family history.  Social History Social History  Substance Use Topics  . Smoking status: Former Smoker    Packs/day: 1.00    Years: 3.00  . Smokeless tobacco: Never Used  . Alcohol use 0.0 oz/week     Comment: occasionally     Allergies   Review of patient's allergies indicates no known allergies.   Review of Systems Review of Systems  Constitutional: Negative for appetite change.  HENT: Negative for hearing loss and  tinnitus.   Respiratory: Positive for shortness of breath. Negative for wheezing.   Neurological: Positive for dizziness and light-headedness.  All other systems reviewed and are negative.    Physical Exam Updated Vital Signs BP (!) 131/52 (BP Location: Left Arm)   Pulse 65   Temp 98.8 F (37.1 C) (Oral)   Resp 20   SpO2 100%   Physical Exam  Constitutional: He is oriented to person, place, and time. He appears well-developed and well-nourished.  HENT:  Head: Normocephalic and atraumatic.  Eyes: EOM are normal.  Neck: Normal range of motion.  Cardiovascular: Normal rate, regular rhythm, normal heart sounds and intact distal pulses.   Pulmonary/Chest: Effort normal and breath sounds normal. No respiratory distress.  Abdominal: Soft. He exhibits no distension. There is no tenderness.  Musculoskeletal: Normal range of motion.  Neurological: He is alert and oriented to person, place, and time.  Skin: Skin is warm and dry.  Psychiatric: He has a normal mood and affect. Judgment normal.  Nursing note and vitals reviewed.    ED Treatments / Results  DIAGNOSTIC STUDIES: Oxygen Saturation is 100% on RA, normal by my interpretation.    COORDINATION OF CARE: 11:13 PM-Discussed treatment plan which includes labs and EKG with pt at bedside and pt agreed to plan.    Labs (all labs ordered are listed, but only abnormal results are displayed) Labs Reviewed  BASIC METABOLIC PANEL - Abnormal; Notable for the following:       Result Value   Potassium 3.3 (*)  Glucose, Bld 104 (*)    All other components within normal limits  CBG MONITORING, ED - Abnormal; Notable for the following:    Glucose-Capillary 120 (*)    All other components within normal limits  CBC  URINALYSIS, ROUTINE W REFLEX MICROSCOPIC (NOT AT [facility])    EKG  EKG Interpretation  Date/Time:  Sunday January 03 2016 19:50:07 EDT Ventricular Rate:  70 PR Interval:    QRS Duration: 103 QT Interval:  372 QTC  Calculation: 402 R Axis:   89 Text Interpretation:  Sinus rhythm Consider left atrial enlargement ST elev, probable normal early repol pattern Unchanged from 12/22/15 Confirmed by Wilmore Holsomback  MD, Josi Roediger ([id number]) on 01/03/2016 11:04:04 PM       Radiology No results found.  Procedures Procedures (including critical care time)  Medications Ordered in ED Medications - No data to display   Initial Impression / Assessment and Plan / ED Course  I have reviewed the triage vital signs and the nursing notes.  Pertinent labs & imaging results that were available during my care of the patient were reviewed by me and considered in my medical decision making (see chart for details).  Clinical Course     Final Clinical Impressions(s) / ED Diagnoses   Final diagnoses:  None   Patient presents with complaints of intermittent dizziness and difficulty breathing. He has been having these episodes for the past 2 weeks. He was seen here for similar complaints 2 weeks ago and was told he may be dehydrated. He has been drinking plenty of fluids, however continues to experience the symptoms.  On exam, vitals are stable and he is afebrile. His physical examination is unremarkable and he appears very well.  Laboratory studies are unremarkable an EKG is unchanged. I highly doubt any emergent, life-threatening pathology. I will advise him to give this time. If he does not improve he can follow-up with his primary doctor.  I personally performed the services described in this documentation, which was scribed in my presence. The recorded information has been reviewed and is accurate.     New Prescriptions New Prescriptions   No medications on file     Geoffery Lyons, MD 01/04/16 [phone]

## 2016-01-06 ENCOUNTER — Emergency Department (HOSPITAL_COMMUNITY)
Admission: EM | Admit: 2016-01-06 | Discharge: 2016-01-07 | Disposition: A | Discharge status: Home / Self Care | Payer: PRIVATE HEALTH INSURANCE | Attending: Emergency Medicine | Admitting: Emergency Medicine

## 2016-01-06 ENCOUNTER — Emergency Department (HOSPITAL_COMMUNITY): Payer: PRIVATE HEALTH INSURANCE

## 2016-01-06 ENCOUNTER — Encounter (HOSPITAL_COMMUNITY): Payer: Self-pay | Admitting: *Deleted

## 2016-01-06 DIAGNOSIS — R1310 Dysphagia, unspecified: Secondary | ICD-10-CM | POA: Diagnosis not present

## 2016-01-06 DIAGNOSIS — R42 Dizziness and giddiness: Secondary | ICD-10-CM | POA: Diagnosis not present

## 2016-01-06 DIAGNOSIS — E876 Hypokalemia: Secondary | ICD-10-CM | POA: Insufficient documentation

## 2016-01-06 DIAGNOSIS — Z87891 Personal history of nicotine dependence: Secondary | ICD-10-CM | POA: Diagnosis not present

## 2016-01-06 LAB — CBC
HEMATOCRIT: 42.2 % (ref 39.0–52.0)
HEMOGLOBIN: 14.3 g/dL (ref 13.0–17.0)
MCH: 29.4 pg (ref 26.0–34.0)
MCHC: 33.9 g/dL (ref 30.0–36.0)
MCV: 86.8 fL (ref 78.0–100.0)
Platelets: 263 10*3/uL (ref 150–400)
RBC: 4.86 MIL/uL (ref 4.22–5.81)
RDW: 12.3 % (ref 11.5–15.5)
WBC: 5.5 10*3/uL (ref 4.0–10.5)

## 2016-01-06 LAB — BASIC METABOLIC PANEL
ANION GAP: 14 (ref 5–15)
BUN: 11 mg/dL (ref 6–20)
CALCIUM: 10.2 mg/dL (ref 8.9–10.3)
CHLORIDE: 100 mmol/L — AB (ref 101–111)
CO2: 23 mmol/L (ref 22–32)
Creatinine, Ser: 1.33 mg/dL — ABNORMAL HIGH (ref 0.61–1.24)
GFR calc non Af Amer: >60 mL/min (ref >60)
GLUCOSE: 110 mg/dL — AB (ref 65–99)
POTASSIUM: 3.3 mmol/L — AB (ref 3.5–5.1)
Sodium: 137 mmol/L (ref 135–145)

## 2016-01-06 NOTE — ED Triage Notes (Addendum)
Pt reports having difficulty swallowing x 2 weeks, pt states when episodes become more severe he feels anxious and has sob, feels lightheaded. Airway intact at triage, able to speak in full sentences. [facility].

## 2016-01-06 NOTE — ED Notes (Signed)
Patient transported to X-ray at this time via ED stretcher.  Pt in no apparent distress at this time.   

## 2016-01-06 NOTE — ED Provider Notes (Signed)
[facility]-EMERGENCY DEPT Provider Note   CSN: [id number] Arrival date & time: 01/06/16  1508     History   Chief Complaint Chief Complaint  Patient presents with  . Dysphagia    HPI Calvin Larsen is a 25 y.o. male.  The history is provided by the patient and medical records.     25 year old male with no significant past medical history presenting to the ED for some difficulty swallowing. He states is been ongoing for the past 2 weeks. He states when he tries to swallow food or drink he feels that it "goes wrong way". States eventually it will pass down his throat but "feels weird". He has not had any nausea or vomiting. He denies any shortness of breath. Denies any allergic exposures as he has no known allergies. He denies any fever or chills. States swallowing is not painful. No sick contacts. Patient states he has been very worried and anxious about this and he thinks he has been experiencing anxiety. He reports over the past few weeks he has had several episodes at work where he will have racing heart rate, lightheadedness, and shortness of breath. He states these episodes are transient lasting 30 seconds or less. He denies any syncopal episodes. No chest pain or SOB.  States he has never been diagnosed with anxiety in the past, however he looked up his symptoms on the Internet and feels that is what he is having. He denies any significant added stress, but does report he worries during the day and tends to ruminate on his thoughts. He denies any suicidal or homicidal ideation. No hallucinations.  History reviewed. No pertinent past medical history.  There are no active problems to display for this patient.   History reviewed. No pertinent surgical history.     Home Medications    Prior to Admission medications   Not on File    Family History History reviewed. No pertinent family history.  Social History Social History  Substance Use Topics  . Smoking status: Former Smoker     Packs/day: 1.00    Years: 3.00  . Smokeless tobacco: Never Used  . Alcohol use 0.0 oz/week     Comment: occasionally     Allergies   Review of patient's allergies indicates no known allergies.   Review of Systems Review of Systems  HENT: Positive for trouble swallowing.   All other systems reviewed and are negative.    Physical Exam Updated Vital Signs BP 109/61 (BP Location: Left Arm)   Pulse (!) 58   Temp 98.1 F (36.7 C) (Oral)   Resp 12   SpO2 100%   Physical Exam  Constitutional: He is oriented to person, place, and time. He appears well-developed and well-nourished. No distress.  HENT:  Head: Normocephalic and atraumatic.  Right Ear: Tympanic membrane and external ear normal.  Left Ear: Tympanic membrane and external ear normal.  Nose: Nose normal.  Mouth/Throat: Uvula is midline, oropharynx is clear and moist and mucous membranes are normal.  Tonsils overall normal in appearance bilaterally without exudate; uvula midline without evidence of peritonsillar abscess; handling secretions appropriately; no difficulty swallowing or speaking; normal phonation without stridor  Eyes: Conjunctivae and EOM are normal. Pupils are equal, round, and reactive to light.  Neck: Trachea normal, normal range of motion and full passive range of motion without pain. Neck supple. No neck rigidity. No thyroid mass present.  No masses, trachea midline, normal phonation without stridor  Cardiovascular: Normal rate, regular rhythm and normal heart  sounds.   No murmur heard. Pulmonary/Chest: Effort normal and breath sounds normal. No stridor. No respiratory distress. He has no wheezes. He has no rhonchi.  Abdominal: Soft. Bowel sounds are normal. There is no tenderness. There is no guarding.  Musculoskeletal: Normal range of motion. He exhibits no edema.  Neurological: He is alert and oriented to person, place, and time. He has normal strength. He displays no tremor. No cranial nerve  deficit or sensory deficit. He displays no seizure activity.  AAOx3, answering questions and following commands appropriately; equal strength UE and LE bilaterally; CN grossly intact; moves all extremities appropriately without ataxia; no focal neuro deficits or facial asymmetry appreciated  Skin: Skin is warm and dry. No rash noted. He is not diaphoretic.  Psychiatric: He has a normal mood and affect. His behavior is normal. Thought content normal.  Nursing note and vitals reviewed.    ED Treatments / Results  Labs (all labs ordered are listed, but only abnormal results are displayed) Labs Reviewed  BASIC METABOLIC PANEL - Abnormal; Notable for the following:       Result Value   Potassium 3.3 (*)    Chloride 100 (*)    Glucose, Bld 110 (*)    Creatinine, Ser 1.33 (*)    All other components within normal limits  RAPID STREP SCREEN (NOT AT [facility])  CULTURE, GROUP A STREP [facility])  CBC    EKG  EKG Interpretation None       Radiology Dg Neck Soft Tissue  Result Date: 01/06/2016 CLINICAL DATA:  25 y/o M; 2 days of difficulty swallowing without pain. EXAM: NECK SOFT TISSUES - 1+ VIEW COMPARISON:  None. FINDINGS: There is no evidence of retropharyngeal soft tissue swelling or epiglottic enlargement. The cervical airway is unremarkable and no radio-opaque foreign body identified. IMPRESSION: Negative. Electronically Signed   By: Mitzi HansenLance  Furusawa-Stratton M.D.   On: 01/06/2016 23:58    Procedures Procedures (including critical care time)  Medications Ordered in ED Medications - No data to display   Initial Impression / Assessment and Plan / ED Course  I have reviewed the triage vital signs and the nursing notes.  Pertinent labs & imaging results that were available during my care of the patient were reviewed by me and considered in my medical decision making (see chart for details).  Clinical Course   25 year old male here with dysphagia and episodic lightheadedness. He has  had ED visits recently for the same with negative workup. Patient states he does feel anxious about her swallowing issues. He has not had any emesis, rather states it feels "weird" when he swallows. On exam he is afebrile and nontoxic. His tonsils are normal in appearance and he does not have any clinical signs or symptoms suggestive of strep pharyngitis. He has normal phonation without stridor, trachea is midline. He is handling his secretions well and has no apparent difficulty swallowing soda which he is drinking in the room.  Does not appear to be allergic type reaction. Neurologically intact.  His lab work today is reassuring. He does have a mild hypokalemia at 3.3, encouraged intake of potassium rich foods at home such as bananas.  Soft tissue films of neck without acute findings.  Rapid strep negative.  Patient has continued handling his secretions well.  No recurrence of lightheadedness from earlier.  Suspect he may have some component of anxiety contributing to his symptoms. Patient agrees with this.  He appears deeply concerned about his swallowing issues, however no issues evident  here.  Has appt with ENT on Friday for further evaluation of this.  Will also refer to GI.  Discussed plan with patient, he acknowledged understanding and agreed with plan of care.  Return precautions given for new or worsening symptoms.  Final Clinical Impressions(s) / ED Diagnoses   Final diagnoses:  Swallowing problem  Episodic lightheadedness    New Prescriptions New Prescriptions   No medications on file     Garlon HatchetLisa M Maddyx Wieck, PA-C 01/07/16 0350    Garlon HatchetLisa M Stonewall Doss, PA-C 01/07/16 0401    Layla MawKristen N Ward, DO 01/07/16 0502

## 2016-01-07 LAB — RAPID STREP SCREEN (MED CTR MEBANE ONLY): Streptococcus, Group A Screen (Direct): NEGATIVE

## 2016-01-07 NOTE — ED Notes (Signed)
Pt provided with d/c instructions at this time. Pt verbalizes understanding of d/c instructions as well as follow up procedure after d/c. No new RX at time of d/c.  Pt in no apparent distress at this time. Pt ambulatory at time of d/c.   

## 2016-01-07 NOTE — Discharge Instructions (Signed)
Recommend that you follow-up with GI if you continue having issues swallowing-- call their office to make appt. You may also wish to follow-up with the wellness center if you continue having issues of lightheadedness, etc. Return here for any new/worsening symptoms.

## 2016-01-07 NOTE — ED Notes (Signed)
Attempted to D/C pt at this time. Pt states that he wishes to speak with the provider again prior to d/c.

## 2016-01-08 ENCOUNTER — Encounter (HOSPITAL_COMMUNITY): Payer: Self-pay

## 2016-01-08 ENCOUNTER — Emergency Department (HOSPITAL_COMMUNITY)
Admission: EM | Admit: 2016-01-08 | Discharge: 2016-01-08 | Disposition: A | Discharge status: Home / Self Care | Payer: PRIVATE HEALTH INSURANCE | Attending: Emergency Medicine | Admitting: Emergency Medicine

## 2016-01-08 DIAGNOSIS — R008 Other abnormalities of heart beat: Secondary | ICD-10-CM | POA: Diagnosis present

## 2016-01-08 DIAGNOSIS — Z87891 Personal history of nicotine dependence: Secondary | ICD-10-CM | POA: Insufficient documentation

## 2016-01-08 DIAGNOSIS — R002 Palpitations: Secondary | ICD-10-CM | POA: Diagnosis not present

## 2016-01-08 NOTE — ED Notes (Signed)
Patient not in room at time of discharge, patient assumed to have left prior to receiving discharge instructions or vital signs, or obtaining discharge signature.

## 2016-01-08 NOTE — ED Triage Notes (Signed)
Pt states approx 1 hour ago, he felt heart palpitations.  When asked if occurring now, pt states "I don't know, not sure, maybe some anxiety".  Has been seen for same several times recently and states no diagnosis during events.  States feels flutters.  Denies shortness of breath. No reflux symptoms or muscle injury noted.

## 2016-01-08 NOTE — ED Notes (Signed)
Per MD, hold additional orders. EKG completed.

## 2016-01-08 NOTE — ED Notes (Signed)
Due to this RN being in a conscious sedation with another patient and being unable to leave that patient's bedside and due to this patient leaving prior to receiving discharge instructions by RN, this RN did not see or interact with patient.

## 2016-01-08 NOTE — ED Provider Notes (Signed)
[facility]-EMERGENCY DEPT Provider Note   CSN: [id number] Arrival date & time: 01/08/16  1115     History   Chief Complaint Chief Complaint  Patient presents with  . Irregular Heart Beat    HPI Calvin Larsen is a 25 y.o. male.  Presents complaining of intermittent palpitations. Has had some issues swallowing over the past several days. Reports increasing mucous over the past several days. Reports several visits to ER for similar symptoms this month. 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 No hx of medical issues. No new meds. No family hx of early heart disease. No syncope.    The history is provided by the patient and medical records.      History reviewed. No pertinent past medical history.  There are no active problems to display for this patient.   History reviewed. No pertinent surgical history.     Home Medications    Prior to Admission medications   Not on File    Family History History reviewed. No pertinent family history.  Social History Social History  Substance Use Topics  . Smoking status: Former Smoker    Packs/day: 1.00    Years: 3.00  . Smokeless tobacco: Never Used  . Alcohol use 0.0 oz/week     Comment: occasionally     Allergies   Review of patient's allergies indicates no known allergies.   Review of Systems Review of Systems  All other systems reviewed and are negative.    Physical Exam Updated Vital Signs BP 106/75 (BP Location: Left Arm)   Pulse 61   Temp 98 F (36.7 C) (Oral)   Resp 18   Ht 5\' 7"  (1.702 m)   Wt 140 lb (63.5 kg)   SpO2 100%   BMI 21.93 kg/m   Physical Exam  Constitutional: He is oriented to person, place, and time. He appears well-developed and well-nourished.  HENT:  Head: Normocephalic and atraumatic.  Eyes: EOM are normal.  Neck: Normal range of motion. Neck supple. No tracheal deviation present. No thyromegaly  present.  Thyroid feels normal  Cardiovascular: Normal rate, regular rhythm and normal heart sounds.   No murmur heard. Pulmonary/Chest: Effort normal and breath sounds normal.  Abdominal: Soft. There is no tenderness.  Musculoskeletal: He exhibits no edema.  Lymphadenopathy:    He has no cervical adenopathy.  Neurological: He is alert and oriented to person, place, and time.  Skin: Skin is warm and dry. No pallor.  Psychiatric: He has a normal mood and affect. Judgment normal.  Nursing note and vitals reviewed.    ED Treatments / Results  Labs (all labs ordered are listed, but only abnormal results are displayed) Labs Reviewed - No data to display  EKG  EKG Interpretation  Date/Time:  Friday January 08 2016 11:26:33 EDT Ventricular Rate:  66 PR Interval:    QRS Duration: 102 QT Interval:  385 QTC Calculation: 404 R Axis:   95 Text Interpretation:  Sinus rhythm RAE, consider biatrial enlargement Borderline right axis deviation ST elev, probable normal early repol pattern No significant change was found Confirmed by Abbygayle Helfand  MD, Caryn BeeKEVIN ([id number]) on 01/08/2016 1:26:32 PM       Radiology Dg Neck Soft Tissue  Result Date: 01/06/2016 CLINICAL DATA:  25 y/o M; 2 days of difficulty swallowing without pain. EXAM: NECK SOFT TISSUES - 1+ VIEW COMPARISON:  None. FINDINGS: There is no evidence  of retropharyngeal soft tissue swelling or epiglottic enlargement. The cervical airway is unremarkable and no radio-opaque foreign body identified. IMPRESSION: Negative. Electronically Signed   By: Mitzi HansenLance  Furusawa-Stratton M.D.   On: 01/06/2016 23:58    Procedures Procedures (including critical care time)  Medications Ordered in ED Medications - No data to display   Initial Impression / Assessment and Plan / ED Course  I have reviewed the triage vital signs and the nursing notes.  Pertinent labs & imaging results that were available during my care of the patient were reviewed by me and  considered in my medical decision making (see chart for details).  Clinical Course    Overall the patient is well-appearing.  He ate peanut butter crackers while I was in the room.  He is tolerating ginger ale without issues.  No swallowing difficulty.  He is definitely having some degree of anxiety and palpitations associated with this.  His EKG is normal sinus rhythm today.  I think he is stable for discharge from the emergency department and close follow-up with his primary care physician.  Final Clinical Impressions(s) / ED Diagnoses   Final diagnoses:  None    New Prescriptions New Prescriptions   No medications on file     Azalia BilisKevin Janeya Deyo, MD 01/08/16 1407

## 2016-01-09 LAB — CULTURE, GROUP A STREP (THRC)

## 2016-02-09 ENCOUNTER — Emergency Department (HOSPITAL_COMMUNITY)
Admission: EM | Admit: 2016-02-09 | Discharge: 2016-02-10 | Disposition: A | Discharge status: Home / Self Care | Payer: PRIVATE HEALTH INSURANCE | Attending: Emergency Medicine | Admitting: Emergency Medicine

## 2016-02-09 ENCOUNTER — Encounter (HOSPITAL_COMMUNITY): Payer: Self-pay | Admitting: Emergency Medicine

## 2016-02-09 ENCOUNTER — Emergency Department (HOSPITAL_COMMUNITY): Payer: PRIVATE HEALTH INSURANCE

## 2016-02-09 DIAGNOSIS — R0789 Other chest pain: Secondary | ICD-10-CM | POA: Insufficient documentation

## 2016-02-09 DIAGNOSIS — Y929 Unspecified place or not applicable: Secondary | ICD-10-CM | POA: Insufficient documentation

## 2016-02-09 DIAGNOSIS — Z79899 Other long term (current) drug therapy: Secondary | ICD-10-CM | POA: Insufficient documentation

## 2016-02-09 DIAGNOSIS — Y9389 Activity, other specified: Secondary | ICD-10-CM | POA: Insufficient documentation

## 2016-02-09 DIAGNOSIS — Y999 Unspecified external cause status: Secondary | ICD-10-CM | POA: Insufficient documentation

## 2016-02-09 DIAGNOSIS — R079 Chest pain, unspecified: Secondary | ICD-10-CM | POA: Diagnosis present

## 2016-02-09 DIAGNOSIS — Z87891 Personal history of nicotine dependence: Secondary | ICD-10-CM | POA: Insufficient documentation

## 2016-02-09 DIAGNOSIS — X500XXA Overexertion from strenuous movement or load, initial encounter: Secondary | ICD-10-CM | POA: Diagnosis not present

## 2016-02-09 LAB — CBC
HCT: 40.7 % (ref 39.0–52.0)
Hemoglobin: 14 g/dL (ref 13.0–17.0)
MCH: 28.5 pg (ref 26.0–34.0)
MCHC: 34.4 g/dL (ref 30.0–36.0)
MCV: 82.9 fL (ref 78.0–100.0)
PLATELETS: 247 10*3/uL (ref 150–400)
RBC: 4.91 MIL/uL (ref 4.22–5.81)
RDW: 12.4 % (ref 11.5–15.5)
WBC: 6.6 10*3/uL (ref 4.0–10.5)

## 2016-02-09 LAB — I-STAT TROPONIN, ED: Troponin i, poc: 0 ng/mL (ref 0.00–0.08)

## 2016-02-09 LAB — SEDIMENTATION RATE: SED RATE: 0 mm/h (ref 0–16)

## 2016-02-09 LAB — BASIC METABOLIC PANEL
Anion gap: 12 (ref 5–15)
BUN: 16 mg/dL (ref 6–20)
CALCIUM: 10.1 mg/dL (ref 8.9–10.3)
CO2: 23 mmol/L (ref 22–32)
CREATININE: 1.16 mg/dL (ref 0.61–1.24)
Chloride: 102 mmol/L (ref 101–111)
GFR calc Af Amer: >60 mL/min (ref >60)
Glucose, Bld: 87 mg/dL (ref 65–99)
Potassium: 4.1 mmol/L (ref 3.5–5.1)
SODIUM: 137 mmol/L (ref 135–145)

## 2016-02-09 MED ORDER — ASPIRIN 81 MG PO CHEW
324.0000 mg | CHEWABLE_TABLET | Freq: Once | ORAL | Status: AC
  Administered 2016-02-09: 324 mg via ORAL
  Filled 2016-02-09: qty 4

## 2016-02-09 MED ORDER — NAPROXEN 500 MG PO TABS
500.0000 mg | ORAL_TABLET | Freq: Two times a day (BID) | ORAL | 0 refills | Status: AC
Start: 2016-02-09 — End: 2016-02-16

## 2016-02-09 NOTE — ED Triage Notes (Signed)
Pt c/o left sided chest pain for the past 2 days. Pt states it hurts worse when he is working. Reports a light headedness feeling that occurred but unsure if that is related to panic due to chest pain.

## 2016-02-09 NOTE — ED Provider Notes (Signed)
[facility]-EMER[facility]Y DEPT Provider Note   CSN: [id number] Arrival date & time: 02/09/16  2112     History   Chief Complaint Chief Complaint  Patient presents with  . Chest Pain    HPI Calvin Larsen is a 25 y.o. male.  The history is provided by the patient.  Chest Pain   This is a recurrent problem. Episode onset: 1 week ago. The problem occurs constantly. Progression since onset: waxing and waning. The pain is associated with lifting, movement and rest. The pain is present in the lateral region (left side, occasionally right). The pain is mild. The quality of the pain is described as burning and sharp. The pain does not radiate. Duration of episode(s) is 1 week. Pertinent negatives include no abdominal pain, no cough, no lower extremity edema, no shortness of breath and no vomiting. He has tried nothing for the symptoms. Risk factors include male gender.    History reviewed. No pertinent past medical history.  There are no active problems to display for this patient.   History reviewed. No pertinent surgical history.     Home Medications    Prior to Admission medications   Medication Sig Start Date End Date Taking? Authorizing Provider  omeprazole (PRILOSEC) 40 MG capsule Take 40 mg by mouth daily.   Yes Historical Provider, MD    Family History No family history on file.  Social History Social History  Substance Use Topics  . Smoking status: Former Smoker    Packs/day: 1.00    Years: 3.00  . Smokeless tobacco: Never Used  . Alcohol use 0.0 oz/week     Comment: occasionally     Allergies   Review of patient's allergies indicates no known allergies.   Review of Systems Review of Systems  Respiratory: Negative for cough and shortness of breath.   Cardiovascular: Positive for chest pain.  Gastrointestinal: Negative for abdominal pain and vomiting.  All other systems reviewed and are negative.    Physical Exam Updated Vital Signs BP 114/60 (BP Location:  Left Arm)   Pulse 87   Temp 98.1 F (36.7 C) (Oral)   Resp 16   SpO2 99%   Physical Exam  Constitutional: He is oriented to person, place, and time. He appears well-developed and well-nourished. No distress.  HENT:  Head: Normocephalic and atraumatic.  Eyes: Conjunctivae are normal.  Neck: Neck supple. No tracheal deviation present.  Cardiovascular: Normal rate, regular rhythm and normal heart sounds.  Exam reveals no gallop and no friction rub.   No murmur heard. Pulmonary/Chest: Effort normal and breath sounds normal. No respiratory distress. He has no wheezes.  Abdominal: Soft. He exhibits no distension. There is no tenderness.  Neurological: He is alert and oriented to person, place, and time.  Skin: Skin is warm and dry.  Psychiatric: He has a normal mood and affect.     ED Treatments / Results  Labs (all labs ordered are listed, but only abnormal results are displayed) Labs Reviewed  BASIC METABOLIC PANEL  CBC  SEDIMENTATION RATE  C-REACTIVE PROTEIN  I-STAT TROPOININ, ED     EKG  EKG Interpretation  Date/Time:  Tuesday February 09 2016 21:56:31 EDT Ventricular Rate:  68 PR Interval:    QRS Duration: 103 QT Interval:  379 QTC Calculation: 403 R Axis:   97 Text Interpretation:  Sinus rhythm Right atrial enlargement ST elevation, consider early repolarization, pericarditis, or injury Confirmed by Aarit Kashuba MD, Chrisean Kloth ([id number]) on 02/09/2016 10:17:00 PM  Radiology Dg Chest 2 View  Result Date: 02/09/2016 CLINICAL DATA:  Initial evaluation for acute upper chest pain, occasional shortness of breath. Occasional smoker. EXAM: CHEST  2 VIEW COMPARISON:  Prior radiograph from 12/02/2015. FINDINGS: The cardiac and mediastinal silhouettes are stable in size and contour, and remain within normal limits. The lungs are normally inflated. No airspace consolidation, pleural effusion, or pulmonary edema is identified. There is no pneumothorax. No acute osseous abnormality  identified. IMPRESSION: No active cardiopulmonary disease. Electronically Signed   By: Rise MuBenjamin  McClintock M.D.   On: 02/09/2016 22:15    Procedures Procedures (including critical care time)  Medications Ordered in ED Medications  aspirin chewable tablet 324 mg (324 mg Oral Given 02/09/16 2220)     Initial Impression / Assessment and Plan / ED Course  I have reviewed the triage vital signs and the nursing notes.  Pertinent labs & imaging results that were available during my care of the patient were reviewed by me and considered in my medical decision making (see chart for details).  Clinical Course    25 y.o. male presents with 1 week of left-sided chest pain that has been intermittent and mostly when he is working. Heart score is less than 3 and low risk with repolarization pattern on EKG but no signs of active ischemia despite ongoing pain. Troponin is negative. Considered pericarditis with EKG morphology and symptoms so screening and inflammatory markers sent. No typical symptoms of ACS or pericarditis are noted. Suspect that this is related to a chest wall pain as the patient is otherwise healthy and well-appearing. 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 Patient was recommended to take short course of scheduled NSAIDs and engage in early mobility as definitive treatment.   Final Clinical Impressions(s) / ED Diagnoses   Final diagnoses:  Chest wall pain    New Prescriptions New Prescriptions   No medications on file     Lyndal Pulleyaniel Farhiya Rosten, MD 02/10/16 [phone]

## 2016-02-10 LAB — C-REACTIVE PROTEIN

## 2016-02-10 NOTE — ED Notes (Signed)
Pt ambulatory and independent at discharge.  Verbalized understanding of discharge instructions 

## 2016-12-06 ENCOUNTER — Emergency Department (HOSPITAL_COMMUNITY): Payer: PRIVATE HEALTH INSURANCE

## 2016-12-06 ENCOUNTER — Encounter (HOSPITAL_COMMUNITY): Payer: Self-pay

## 2016-12-06 ENCOUNTER — Other Ambulatory Visit: Payer: Self-pay

## 2016-12-06 ENCOUNTER — Emergency Department (HOSPITAL_COMMUNITY)
Admission: EM | Admit: 2016-12-06 | Discharge: 2016-12-06 | Disposition: A | Discharge status: Home / Self Care | Payer: PRIVATE HEALTH INSURANCE | Attending: Emergency Medicine | Admitting: Emergency Medicine

## 2016-12-06 DIAGNOSIS — Z87891 Personal history of nicotine dependence: Secondary | ICD-10-CM | POA: Insufficient documentation

## 2016-12-06 DIAGNOSIS — K219 Gastro-esophageal reflux disease without esophagitis: Secondary | ICD-10-CM | POA: Insufficient documentation

## 2016-12-06 DIAGNOSIS — R0789 Other chest pain: Secondary | ICD-10-CM | POA: Insufficient documentation

## 2016-12-06 DIAGNOSIS — Z79899 Other long term (current) drug therapy: Secondary | ICD-10-CM | POA: Insufficient documentation

## 2016-12-06 DIAGNOSIS — R079 Chest pain, unspecified: Secondary | ICD-10-CM

## 2016-12-06 LAB — I-STAT TROPONIN, ED
TROPONIN I, POC: 0 ng/mL (ref 0.00–0.08)
Troponin i, poc: 0 ng/mL (ref 0.00–0.08)

## 2016-12-06 LAB — BASIC METABOLIC PANEL
ANION GAP: 6 (ref 5–15)
BUN: 7 mg/dL (ref 6–20)
CHLORIDE: 104 mmol/L (ref 101–111)
CO2: 30 mmol/L (ref 22–32)
Calcium: 9.9 mg/dL (ref 8.9–10.3)
Creatinine, Ser: 1.08 mg/dL (ref 0.61–1.24)
GFR calc non Af Amer: >60 mL/min (ref >60)
GLUCOSE: 96 mg/dL (ref 65–99)
POTASSIUM: 4.5 mmol/L (ref 3.5–5.1)
Sodium: 140 mmol/L (ref 135–145)

## 2016-12-06 LAB — CBC
HCT: 40.3 % (ref 39.0–52.0)
HEMOGLOBIN: 13.2 g/dL (ref 13.0–17.0)
MCH: 28 pg (ref 26.0–34.0)
MCHC: 32.8 g/dL (ref 30.0–36.0)
MCV: 85.6 fL (ref 78.0–100.0)
Platelets: 199 10*3/uL (ref 150–400)
RBC: 4.71 MIL/uL (ref 4.22–5.81)
RDW: 13.3 % (ref 11.5–15.5)
WBC: 3.3 10*3/uL — ABNORMAL LOW (ref 4.0–10.5)

## 2016-12-06 MED ORDER — SUCRALFATE 1 G PO TABS
1.0000 g | ORAL_TABLET | Freq: Once | ORAL | Status: AC
  Administered 2016-12-06: 1 g via ORAL
  Filled 2016-12-06: qty 1

## 2016-12-06 MED ORDER — FAMOTIDINE IN NACL 20-0.9 MG/50ML-% IV SOLN: 20.0000 mg | Freq: Once | INTRAVENOUS | Status: DC

## 2016-12-06 MED ORDER — GI COCKTAIL ~~LOC~~
30.0000 mL | Freq: Once | ORAL | Status: AC
  Administered 2016-12-06: 30 mL via ORAL
  Filled 2016-12-06: qty 30

## 2016-12-06 MED ORDER — FAMOTIDINE 20 MG PO TABS
20.0000 mg | ORAL_TABLET | Freq: Once | ORAL | Status: AC
  Administered 2016-12-06: 20 mg via ORAL
  Filled 2016-12-06: qty 1

## 2016-12-06 NOTE — ED Provider Notes (Signed)
[facility]-EMERGENCY DEPT Provider Note   CSN: [id number] Arrival date & time: 12/06/16  0908     History   Chief Complaint Chief Complaint  Patient presents with  . Chest Pain    HPI Calvin Larsen is a 26 y.o. male.  Pt reports midline epigastric pain with radiation to his left arm and back that began this morning.  The pain is intermittent and is worse when lying down or eating.  He reports sleeping in today and awakening to the pain.  He denies diaphoresis, SOB, nausea, vomiting, diarrhea, fatigue or vision changes.  Pt has a history of GERD and was seen for this in the ED last year and given omeprazole 40mg  daily.  He reports not taking this for the last 2-3 months due to feeling like it wasn't working.  Pt reports feeling like he had to belch beginning last night so he drank some milk and ate some cheese puffs and went to bed.  Pt hasn't eaten yet today.        History reviewed. No pertinent past medical history.  There are no active problems to display for this patient.   History reviewed. No pertinent surgical history.     Home Medications    Prior to Admission medications   Medication Sig Start Date End Date Taking? Authorizing Provider  omeprazole (PRILOSEC) 40 MG capsule Take 40 mg by mouth daily.    [provider]    Family History No family history on file.  Social History Social History  Substance Use Topics  . Smoking status: Former Smoker    Packs/day: 1.00    Years: 3.00  . Smokeless tobacco: Never Used  . Alcohol use 0.0 oz/week     Comment: occasionally     Allergies   Patient has no known allergies.   Review of Systems Review of Systems  Constitutional: Negative for chills, diaphoresis, fatigue and fever.  HENT: Negative for ear pain and sore throat.   Eyes: Negative for pain and visual disturbance.  Respiratory: Negative for cough, shortness of breath, wheezing and stridor.   Cardiovascular: Positive for chest pain. Negative  for palpitations.  Gastrointestinal: Negative for abdominal distention, abdominal pain, constipation, nausea and vomiting.  Genitourinary: Negative for difficulty urinating, dysuria and hematuria.  Musculoskeletal: Negative for arthralgias and back pain.  Skin: Negative for color change and rash.  Neurological: Negative for seizures and syncope.  All other systems reviewed and are negative.    Physical Exam Updated Vital Signs BP 107/65 (BP Location: Right Arm)   Pulse (!) 58   Temp 97.9 F (36.6 C) (Oral)   Resp 16   Ht 5\' 7"  (1.702 m)   Wt 61.2 kg (135 lb)   SpO2 100%   BMI 21.14 kg/m   Physical Exam   ED Treatments / Results  Labs (all labs ordered are listed, but only abnormal results are displayed) Labs Reviewed  CBC - Abnormal; Notable for the following:       Result Value   WBC 3.3 (*)    All other components within normal limits  I-STAT TROPOININ, ED    EKG  EKG Interpretation  Date/Time:  Tuesday December 06 2016 09:08:11 EDT Ventricular Rate:  56 PR Interval:  144 QRS Duration: 104 QT Interval:  396 QTC Calculation: 382 R Axis:   94 Text Interpretation:  Sinus bradycardia Rightward axis Early repolarization Borderline ECG no acute changes  Confirmed by Crista Curb [id number]) on 12/06/2016 9:17:31 AM  Radiology Dg Chest 2 View  Result Date: 12/06/2016 CLINICAL DATA:  Chest pain EXAM: CHEST  2 VIEW COMPARISON:  February 09, 2016 FINDINGS: Lungs are clear. Heart size and pulmonary vascularity are normal. No adenopathy. No pneumothorax. No bone lesions. IMPRESSION: No abnormality noted. Electronically Signed   By: Bretta BangWilliam  Woodruff III M.D.   On: 12/06/2016 09:49    Procedures Procedures (including critical care time)  Medications Ordered in ED Medications - No data to display   Initial Impression / Assessment and Plan / ED Course  I have reviewed the triage vital signs and the nursing notes.  Pertinent labs & imaging results that were available  during my care of the patient were reviewed by me and considered in my medical decision making (see chart for details).     Chest pain described as burning, intermittent, worse with lying flat, feels like belching -ACS workup negative -X-ray, clinical exam, history not concerning for disection -Pepcid, GI cocktail, carafate improved symptoms   Final Clinical Impressions(s) / ED Diagnoses   Final diagnoses:  None    New Prescriptions New Prescriptions   No medications on file     Angelita InglesWinfrey, Dat Derksen B, MD 12/06/16 1320    Alvira MondaySchlossman, Erin, MD 12/07/16 1347

## 2016-12-06 NOTE — Discharge Instructions (Signed)
Please use over the counter zantac or pepcid as needed for GERD symptoms and follow up with your pcp

## 2016-12-06 NOTE — ED Triage Notes (Signed)
Per Pt, Pt is coming from home with complaints of central chest pain that radiates to his back and left arm that started this morning. Pt reports intermittent chest pressure that has subsided at this time.

## 2016-12-06 NOTE — ED Notes (Signed)
Pt ambulatory to restroom

## 2016-12-06 NOTE — ED Notes (Signed)
Got patient undress on the monitor patient is gone to x ray

## 2016-12-06 NOTE — ED Notes (Signed)
Esignature not available. Pt agreeable to discharge and understands instructions.

## 2016-12-23 ENCOUNTER — Encounter (HOSPITAL_COMMUNITY): Payer: Self-pay | Admitting: Family Medicine

## 2016-12-23 ENCOUNTER — Ambulatory Visit (HOSPITAL_COMMUNITY)
Admission: EM | Admit: 2016-12-23 | Discharge: 2016-12-23 | Disposition: A | Discharge status: Home / Self Care | Payer: BLUE CROSS/BLUE SHIELD | Attending: Internal Medicine | Admitting: Internal Medicine

## 2016-12-23 DIAGNOSIS — N4889 Other specified disorders of penis: Secondary | ICD-10-CM

## 2016-12-23 DIAGNOSIS — Z87891 Personal history of nicotine dependence: Secondary | ICD-10-CM | POA: Insufficient documentation

## 2016-12-23 DIAGNOSIS — N390 Urinary tract infection, site not specified: Secondary | ICD-10-CM | POA: Diagnosis present

## 2016-12-23 DIAGNOSIS — R369 Urethral discharge, unspecified: Secondary | ICD-10-CM | POA: Insufficient documentation

## 2016-12-23 DIAGNOSIS — N3090 Cystitis, unspecified without hematuria: Secondary | ICD-10-CM | POA: Insufficient documentation

## 2016-12-23 DIAGNOSIS — Z202 Contact with and (suspected) exposure to infections with a predominantly sexual mode of transmission: Secondary | ICD-10-CM

## 2016-12-23 DIAGNOSIS — R1031 Right lower quadrant pain: Secondary | ICD-10-CM

## 2016-12-23 LAB — POCT URINALYSIS DIP (DEVICE)
Bilirubin Urine: NEGATIVE
GLUCOSE, UA: NEGATIVE mg/dL
Leukocytes, UA: NEGATIVE
Nitrite: NEGATIVE
PROTEIN: NEGATIVE mg/dL
SPECIFIC GRAVITY, URINE: 1.025 (ref 1.005–1.030)
Urobilinogen, UA: 0.2 mg/dL (ref 0.0–1.0)
pH: 5.5 (ref 5.0–8.0)

## 2016-12-23 MED ORDER — CEFTRIAXONE SODIUM 250 MG IJ SOLR
INTRAMUSCULAR | Status: AC
  Filled 2016-12-23: qty 250

## 2016-12-23 MED ORDER — AZITHROMYCIN 250 MG PO TABS
ORAL_TABLET | ORAL | Status: AC
  Filled 2016-12-23: qty 4

## 2016-12-23 MED ORDER — CEFTRIAXONE SODIUM 250 MG IJ SOLR
250.0000 mg | Freq: Once | INTRAMUSCULAR | Status: AC
  Administered 2016-12-23: 250 mg via INTRAMUSCULAR

## 2016-12-23 MED ORDER — AZITHROMYCIN 250 MG PO TABS
1000.0000 mg | ORAL_TABLET | Freq: Once | ORAL | Status: AC
  Administered 2016-12-23: 1000 mg via ORAL

## 2016-12-23 NOTE — ED Provider Notes (Signed)
CSN: [id number]     Arrival date & time 12/23/16  1303 History   First MD Initiated Contact with Patient 12/23/16 1409     Chief Complaint  Patient presents with  . Urinary Tract Infection   (Consider location/radiation/quality/duration/timing/severity/associated sxs/prior Treatment) Pt states that his girlfriend is currently preg and was seen at her ob office and was told that she has "some type" of infection. Pt began to notice burning, pain on voiding, and a discarge feom penial area. He has had a hx of chlymida in the past and wanted to ensure this was not the same. Has not taken anything pta. Denies any n/v/d. No fevers       History reviewed. No pertinent past medical history. History reviewed. No pertinent surgical history. History reviewed. No pertinent family history. Social History  Substance Use Topics  . Smoking status: Former Smoker    Packs/day: 1.00    Years: 3.00  . Smokeless tobacco: Never Used  . Alcohol use 0.0 oz/week     Comment: occasionally    Review of Systems  Constitutional: Negative.   Respiratory: Negative.   Cardiovascular: Negative.   Gastrointestinal: Positive for abdominal pain.  Endocrine: Negative.   Genitourinary: Positive for discharge, penile pain and urgency.  Skin: Negative.   Neurological: Negative.     Allergies  Patient has no known allergies.  Home Medications   Prior to Admission medications   Medication Sig Start Date End Date Taking? Authorizing Provider  omeprazole (PRILOSEC) 40 MG capsule Take 40 mg by mouth daily.    [provider]   Meds Ordered and Administered this Visit   Medications  cefTRIAXone (ROCEPHIN) injection 250 mg (not administered)  azithromycin (ZITHROMAX) tablet 1,000 mg (not administered)    BP 113/65   Pulse (!) 56   Temp 98.3 F (36.8 C)   Resp 16   SpO2 100%  No data found.   Physical Exam  Constitutional: He appears well-developed.  Cardiovascular: Normal rate and regular  rhythm.   Pulmonary/Chest: Effort normal and breath sounds normal.  Abdominal: Bowel sounds are normal. There is tenderness.  LLQ tenderness, neg cv tenderness,   Genitourinary: Penile tenderness present.  Musculoskeletal: Normal range of motion.  Neurological: He is alert.  Skin: Skin is warm.    Urgent Care Course     Procedures (including critical care time)  Labs Review Labs Reviewed  POCT URINALYSIS DIP (DEVICE) - Abnormal; Notable for the following:       Result Value   Ketones, ur TRACE (*)    Hgb urine dipstick SMALL (*)    All other components within normal limits  URINE CULTURE  URINE CYTOLOGY ANCILLARY ONLY    Imaging Review No results found.           MDM   1. Cystitis   2. Potential exposure to STD    We discussed that we would treat for any possible infection today and send off urine for further testing.  If results are positive pt would receive a call.  Use protection with sexual contact    Tobi BastosMitchell, Kijana Estock A, NP 12/23/16 1423

## 2016-12-23 NOTE — ED Triage Notes (Signed)
Pt here for tingling, itching and burning to penis.

## 2016-12-23 NOTE — Discharge Instructions (Signed)
We discussed that we would treat for any possible infection today and send off urine for further testing.  If results are positive pt would receive a call.  Use protection with sexual contact

## 2016-12-24 LAB — URINE CULTURE: CULTURE: NO GROWTH

## 2016-12-26 LAB — URINE CYTOLOGY ANCILLARY ONLY
Chlamydia: NEGATIVE
Neisseria Gonorrhea: NEGATIVE
Trichomonas: NEGATIVE

## 2016-12-27 ENCOUNTER — Telehealth (HOSPITAL_COMMUNITY): Payer: Self-pay | Admitting: *Deleted

## 2016-12-27 NOTE — Telephone Encounter (Signed)
STD results given

## 2017-04-16 ENCOUNTER — Other Ambulatory Visit: Payer: Self-pay

## 2017-04-16 ENCOUNTER — Encounter (HOSPITAL_COMMUNITY): Payer: Self-pay | Admitting: Emergency Medicine

## 2017-04-16 ENCOUNTER — Ambulatory Visit (HOSPITAL_COMMUNITY)
Admission: EM | Admit: 2017-04-16 | Discharge: 2017-04-16 | Disposition: A | Discharge status: Home / Self Care | Payer: BLUE CROSS/BLUE SHIELD | Attending: Family Medicine | Admitting: Family Medicine

## 2017-04-16 DIAGNOSIS — H9313 Tinnitus, bilateral: Secondary | ICD-10-CM

## 2017-04-16 DIAGNOSIS — G501 Atypical facial pain: Secondary | ICD-10-CM
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
## 2017-04-16 NOTE — Discharge Instructions (Signed)
Ibuprofen, take with food to help with soreness and headache. Three times a day. Drink plenty of fluids. Mental rest and limit screen time until symptoms improve. If develop increased headache, worsening of ear ringing, nausea vomiting, dizziness, passing out please be seen in the Ed.

## 2017-09-29 ENCOUNTER — Encounter (HOSPITAL_COMMUNITY): Payer: Self-pay | Admitting: Emergency Medicine

## 2017-09-29 ENCOUNTER — Ambulatory Visit (HOSPITAL_COMMUNITY)
Admission: EM | Admit: 2017-09-29 | Discharge: 2017-09-29 | Disposition: A | Discharge status: Home / Self Care | Payer: 59 | Attending: Family Medicine | Admitting: Family Medicine

## 2017-09-29 DIAGNOSIS — G4484 Primary exertional headache: Secondary | ICD-10-CM

## 2017-09-29 MED ORDER — IBUPROFEN 400 MG PO TABS: 400.0000 mg | ORAL_TABLET | Freq: Four times a day (QID) | ORAL | 0 refills | Status: DC | PRN

## 2017-09-29 MED ORDER — CYCLOBENZAPRINE HCL 10 MG PO TABS: 10.0000 mg | ORAL_TABLET | Freq: Two times a day (BID) | ORAL | 0 refills | Status: DC | PRN

## 2017-09-29 NOTE — ED Triage Notes (Signed)
Pt c/o headaches since monday

## 2017-09-29 NOTE — Discharge Instructions (Signed)
Rest and push fluids Avoid exertional activities Referral to neurology next week for further evaluation and treatment Flexeril prescribed.  Avoid operating or lifting heavy machinery while taking this medication Ibuprofen prescribed.  Take as needed for symptomatic relief Return or go to ER if you have any new or worsening symptoms.

## 2017-09-29 NOTE — ED Provider Notes (Signed)
[facility]   [id number] 09/29/17 Arrival Time: 1131  SUBJECTIVE:  Calvin Larsen is a 27 y.o. male who presents with complaint of a  Patient also complains of HA for 5 days. Localizes his pain to the base of his skull and radiates towards his temples.  He has not tried OTC medications.  His symptoms are made worse with exertion, such as working out and having sexual intercourse.  Denies similar symptoms in the past. Denies fever, chills, nausea, vomiting, aura, rhinorrhea, watery eyes, chest pain, SOB, abdominal pain, weakness, numbness or tingling.    ROS: As per HPI.  History reviewed. No pertinent past medical history. History reviewed. No pertinent surgical history. No Known Allergies                                               Social History   Socioeconomic History  . Marital status: Single    Spouse name: Not on file  . Number of children: Not on file  . Years of education: Not on file  . Highest education level: Not on file  Occupational History  . Not on file  Social Needs  . Financial resource strain: Not on file  . Food insecurity:    Worry: Not on file    Inability: Not on file  . Transportation needs:    Medical: Not on file    Non-medical: Not on file  Tobacco Use  . Smoking status: Former Smoker    Packs/day: 1.00    Years: 3.00    Pack years: 3.00  . Smokeless tobacco: Never Used  Substance and Sexual Activity  . Alcohol use: Yes    Alcohol/week: 0.0 oz    Comment: occasionally  . Drug use: No  . Sexual activity: Yes    Birth control/protection: Condom  Lifestyle  . Physical activity:    Days per week: Not on file    Minutes per session: Not on file  . Stress: Not on file  Relationships  . Social connections:    Talks on phone: Not on file    Gets together: Not on file    Attends religious service: Not on file    Active member of club or organization: Not on file    Attends meetings of clubs or organizations: Not on file   Relationship status: Not on file  . Intimate partner violence:    Fear of current or ex partner: Not on file    Emotionally abused: Not on file    Physically abused: Not on file    Forced sexual activity: Not on file  Other Topics Concern  . Not on file  Social History Narrative  . Not on file   History reviewed. No pertinent family history.  OBJECTIVE:  Vitals:   09/29/17 1211  BP: 108/61  Pulse: 67  Resp: 18  Temp: 98.6 F (37 C)  SpO2: 99%    General appearance: alert; no distress Eyes: PERRLA; EOMI HENT: normocephalic; atraumatic Neck: supple with FROM Lungs: clear to auscultation bilaterally Heart: regular rate and rhythm.  Radial pulses 2+ symmetrical bilaterally Extremities: no edema; symmetrical with no gross deformities Skin: warm and dry Neurologic: CN 2-12 grossly intact; RAM without difficulty; Walks on tip toes, and heel-to-toe without difficulty, negative romberg; 2+ symmetric reflexes; strength and sensation intact bilaterally about the upper and lower extremities Psychological: alert  and cooperative; normal mood and affect  Imaging: No results found.  ASSESSMENT & PLAN:  1. Primary exertional headache     Meds ordered this encounter  Medications  . cyclobenzaprine (FLEXERIL) 10 MG tablet    Sig: Take 1 tablet (10 mg total) by mouth 2 (two) times daily as needed for muscle spasms.    Dispense:  20 tablet    Refill:  0    Order Specific Question:   Supervising Provider    AnswIsa RankinRA WILSON [phone]  . ibuprofen (ADVIL,MOTRIN) 400 MG tablet    Sig: Take 1 tablet (400 mg total) by mouth every 6 (six) hours as needed for headache.    Dispense:  30 tablet    Refill:  0    Order Specific Question:   Supervising Provider    Answer:   Isa Rankin [[id number]]    Rest and push fluids Avoid exertional activities Referral to neurology next week for further evaluation and treatment Flexeril prescribed.  Avoid operating or lifting  heavy machinery while taking this medication Ibuprofen prescribed.  Take as needed for symptomatic relief Return or go to ER if you have any new or worsening symptoms.   Reviewed expectations re: course of current medical issues. Questions answered. Outlined signs and symptoms indicating need for more acute intervention. Patient verbalized understanding. After Visit Summary given.   Rennis Harding, PA-C 09/29/17 1320

## 2017-12-24 ENCOUNTER — Emergency Department (HOSPITAL_COMMUNITY): Payer: Self-pay

## 2017-12-24 ENCOUNTER — Emergency Department (HOSPITAL_COMMUNITY)
Admission: EM | Admit: 2017-12-24 | Discharge: 2017-12-24 | Disposition: A | Discharge status: Home / Self Care | Payer: Self-pay | Attending: Emergency Medicine | Admitting: Emergency Medicine

## 2017-12-24 ENCOUNTER — Other Ambulatory Visit: Payer: Self-pay

## 2017-12-24 ENCOUNTER — Encounter (HOSPITAL_COMMUNITY): Payer: Self-pay | Admitting: Emergency Medicine

## 2017-12-24 DIAGNOSIS — R002 Palpitations: Secondary | ICD-10-CM | POA: Insufficient documentation

## 2017-12-24 DIAGNOSIS — R0789 Other chest pain: Secondary | ICD-10-CM | POA: Insufficient documentation

## 2017-12-24 DIAGNOSIS — Z87891 Personal history of nicotine dependence: Secondary | ICD-10-CM | POA: Insufficient documentation

## 2017-12-24 LAB — TSH: TSH: 0.446 u[IU]/mL (ref 0.350–4.500)

## 2017-12-24 LAB — BASIC METABOLIC PANEL
ANION GAP: 11 (ref 5–15)
BUN: 9 mg/dL (ref 6–20)
CHLORIDE: 104 mmol/L (ref 98–111)
CO2: 26 mmol/L (ref 22–32)
CREATININE: 1.26 mg/dL — AB (ref 0.61–1.24)
Calcium: 9.5 mg/dL (ref 8.9–10.3)
GFR calc non Af Amer: >60 mL/min (ref >60)
Glucose, Bld: 93 mg/dL (ref 70–99)
Potassium: 4 mmol/L (ref 3.5–5.1)
Sodium: 141 mmol/L (ref 135–145)

## 2017-12-24 LAB — CBC
HEMATOCRIT: 41.8 % (ref 39.0–52.0)
Hemoglobin: 13.9 g/dL (ref 13.0–17.0)
MCH: 28.5 pg (ref 26.0–34.0)
MCHC: 33.3 g/dL (ref 30.0–36.0)
MCV: 85.8 fL (ref 78.0–100.0)
PLATELETS: 260 10*3/uL (ref 150–400)
RBC: 4.87 MIL/uL (ref 4.22–5.81)
RDW: 13.2 % (ref 11.5–15.5)
WBC: 5.3 10*3/uL (ref 4.0–10.5)

## 2017-12-24 LAB — TROPONIN I: Troponin I: <0.03 ng/mL (ref <0.03)

## 2017-12-24 MED ORDER — GI COCKTAIL ~~LOC~~
30.0000 mL | Freq: Once | ORAL | Status: AC
  Administered 2017-12-24: 30 mL via ORAL
  Filled 2017-12-24: qty 30

## 2017-12-24 MED ORDER — RANITIDINE HCL 150 MG PO CAPS: 150.0000 mg | ORAL_CAPSULE | Freq: Two times a day (BID) | ORAL | 0 refills | Status: DC

## 2017-12-24 NOTE — ED Triage Notes (Signed)
Patient reports intermittent generalized chest pain x2 weeks. Reports episode of palpitations with SOB today. Reports sx have resolved at this time. Denies drug use.

## 2017-12-24 NOTE — Discharge Instructions (Addendum)
Based on your symptoms, I am referring you to a Primary Doctor and/or Cardiologist for further work-up. Until then, I would recommend:  1.) Avoid coffee or other excessive caffeine intake 2.) Try to avoid heavy lifting or heavy exercise until cleared by a doctor  I am also starting an antacid treatment, to see if it helps. TAKE THIS MEDICATION DAILY, REGARDLESS OF WHETHER YOU ARE CURRENTLY HAVING SYMPTOMS. It works best to PREVENT heartburn/gastritis, rather than treat it primarily.

## 2017-12-24 NOTE — ED Provider Notes (Signed)
[facility] Provider Note   CSN: [id number] Arrival date & time: 12/24/17  1522     History   Chief Complaint Chief Complaint  Patient presents with  . Palpitations    HPI Calvin Larsen is a 27 y.o. male.  HPI   26 yo M with h/o recurrent ED visits for CP here with CP, palpitations. Pt reports intermittent dull, burning CP over the past 2 weeks. He's had associated SOB occasionaly and palpitations. Sx seem to be worse when he is sitting still/not doing anything, improve when moving around. They do seem worse after drinking coffee, alcohol, and eating as well. Has been diagnosed with GERD but does not take anything for it. No nausea, vomiting, diaphoresis. No personal or family h/o CAD or early SCD. CP has been constant >6 hours today. He had some mild palpitations earlier, now resolved. No alleviating factors other than moving around, 'distracting himself."  History reviewed. No pertinent past medical history.  There are no active problems to display for this patient.   History reviewed. No pertinent surgical history.      Home Medications    Prior to Admission medications   Medication Sig Start Date End Date Taking? Authorizing Provider  cyclobenzaprine (FLEXERIL) 10 MG tablet Take 1 tablet (10 mg total) by mouth 2 (two) times daily as needed for muscle spasms. Patient not taking: Reported on 12/24/2017 09/29/17   Wurst, [place], PA-C  ibuprofen (ADVIL,MOTRIN) 400 MG tablet Take 1 tablet (400 mg total) by mouth every 6 (six) hours as needed for headache. Patient not taking: Reported on 12/24/2017 09/29/17   Wurst, [place], PA-C  omeprazole (PRILOSEC) 40 MG capsule Take 40 mg by mouth daily.    [provider]  ranitidine (ZANTAC) 150 MG capsule Take 1 capsule (150 mg total) by mouth 2 (two) times daily for 7 days. 12/24/17 12/31/17  Shaune Pollack, MD    Family History No family history on file.  Social History Social History    Tobacco Use  . Smoking status: Former Smoker    Packs/day: 1.00    Years: 3.00    Pack years: 3.00  . Smokeless tobacco: Never Used  Substance Use Topics  . Alcohol use: Yes    Alcohol/week: 0.0 oz    Comment: occasionally  . Drug use: No     Allergies   Patient has no known allergies.   Review of Systems Review of Systems  Constitutional: Positive for fatigue. Negative for chills and fever.  HENT: Negative for congestion and rhinorrhea.   Eyes: Negative for visual disturbance.  Respiratory: Positive for chest tightness. Negative for cough, shortness of breath and wheezing.   Cardiovascular: Positive for chest pain and palpitations. Negative for leg swelling.  Gastrointestinal: Negative for abdominal pain, diarrhea, nausea and vomiting.  Genitourinary: Negative for dysuria and flank pain.  Musculoskeletal: Negative for neck pain and neck stiffness.  Skin: Negative for rash and wound.  Allergic/Immunologic: Negative for immunocompromised state.  Neurological: Positive for weakness. Negative for syncope and headaches.  All other systems reviewed and are negative.    Physical Exam Updated Vital Signs BP 116/85 (BP Location: Right Arm)   Pulse 65   Temp 98.4 F (36.9 C) (Oral)   Resp 18   SpO2 100%   Physical Exam  Constitutional: He is oriented to person, place, and time. He appears well-developed and well-nourished. No distress.  HENT:  Head: Normocephalic and atraumatic.  Eyes: Conjunctivae are normal.  Neck: Neck supple.  Cardiovascular: Regular rhythm. PMI is not displaced. Exam reveals no friction rub and no decreased pulses.  Murmur heard.  Crescendo decrescendo systolic murmur is present with a grade of 1/6. Pulmonary/Chest: Effort normal and breath sounds normal. No respiratory distress. He has no wheezes. He has no rales.  Abdominal: He exhibits no distension.  Musculoskeletal: He exhibits no edema.  Neurological: He is alert and oriented to person,  place, and time. He exhibits normal muscle tone.  Skin: Skin is warm. Capillary refill takes less than 2 seconds.  Psychiatric: He has a normal mood and affect.  Nursing note and vitals reviewed.    ED Treatments / Results  Labs (all labs ordered are listed, but only abnormal results are displayed) Labs Reviewed  BASIC METABOLIC PANEL - Abnormal; Notable for the following components:      Result Value   Creatinine, Ser 1.26 (*)    All other components within normal limits  CBC  TROPONIN I  TSH    EKG EKG Interpretation  Date/Time:  Sunday December 24 2017 15:42:11 EDT Ventricular Rate:  66 PR Interval:    QRS Duration: 102 QT Interval:  379 QTC Calculation: 397 R Axis:   93 Text Interpretation:  Sinus rhythm Probable left ventricular hypertrophy Anterior ST elevation, probably due to LVH Early repolarization No significant change since last tracing Confirmed by Manville Rico ([id number]) on 12/24/2017 3:56:20 PM   Radiology Dg Chest 2 View  Result Date: 12/24/2017 CLINICAL DATA:  Left-sided chest pain for 2 weeks EXAM: CHEST - 2 VIEW COMPARISON:  12/06/2017 FINDINGS: The heart size and mediastinal contours are within normal limits. Both lungs are clear. The visualized skeletal structures are unremarkable. IMPRESSION: No active cardiopulmonary disease. Electronically Signed   By: Mark  Lukens M.D.   On: 12/24/2017 15:56    Procedures Procedures (including critical care time)  Medications Ordered in ED Medications  gi cocktail (Maalox,Lidocaine,Donnatal) (30 mLs Oral Given 12/24/17 1620)     Initial Impression / Assessment and Plan / ED Course  I have reviewed the triage vital signs and the nursing notes.  Pertinent labs & imaging results that were available during my care of the patient were reviewed by me and considered in my medical decision making (see chart for details).  Clinical Course as of Dec 24 1708  Sun Dec 24, 2017  1625 26 yo M here with CP, palpitations.  Suspect this is multifactorial, primary component of GERD and likely anxiety as well - h/o similar visits. CXR clear, normal heart borders. EKG non-ischemic. Will check trop (single adequate as sx >12 hr, constant), basic labs, and TSH. Of note, pt does have likely benign flow murmur but given his recurrent visits, reported palpitations, will have him f/u w/ a PCP or cards for echo.   [CI]  1704 Lab work very reassuring.  Troponin negative.  Patient does have minimally elevated creatinine.  This has been elevated in the past.  Doubt emergent pathology as above.  Will refer for outpatient follow-up.  Advised him to avoid excessive caffeine.  Will have him trial antacids.   [CI]    Clinical Course User Index [CI] Timiyah Romito, MD     Final Clinical Impressions(s) / ED Diagnoses   Final diagnoses:  Palpitations  Atypical chest pain    ED Discharge Orders        Ordered    ranitidine (ZANTAC) 150 MG capsule  2 times daily     08 /04/19 1706  Shaune PollackIsaacs, Audrinna Sherman, MD 12/24/17 1710

## 2017-12-25 NOTE — Progress Notes (Signed)
Cardiology Office Note   Date:  12/26/2017   ID:  Calvin Larsen, DOB 03/19/1991, MRN [id number]  PCP:  Patient, No Pcp Per  Cardiologist:   Charlton HawsPeter Nishan, MD   No chief complaint on file.     History of Present Illness: Calvin Larsen is a 27 y.o. male who presents for consultation regarding chest pain and palpitations [facility] Dr Shaune Pollackameron Isaacs Intermittent dull burning CP 2 weeks. Associated with dyspnea and palpitations Worse at rest not exertion Worse with ETOH, eating and coffee. History of GERD not on Rx. Pain can last hours Former smoker  Similar visits to ER for same symptoms Rx GI cocktail relief. ? GERD and Anxiety. CXR NAD troponin negative x 2 CR mildly elevated 1.26 no history of DM or HTN. Note made of systolic ejection murmur on exam Telemetry no arrhythmias   He drives a truck locally Younger brother and sister without heart issues    Past Medical History:  Diagnosis Date  . Chest pain   . GERD (gastroesophageal reflux disease)   . Headache   . Palpitations   . SOB (shortness of breath)     Past Surgical History:  Procedure Laterality Date  . NO PAST SURGERIES       No current outpatient medications on file.   No current facility-administered medications for this visit.     Allergies:   Patient has no known allergies.    Social History:  The patient  reports that he has quit smoking. He has a 3.00 pack-year smoking history. He has never used smokeless tobacco. He reports that he drinks alcohol. He reports that he does not use drugs.   Family History:  The patient's family history is not on file.    ROS:  Please see the history of present illness.   Otherwise, review of systems are positive for none.   All other systems are reviewed and negative.    PHYSICAL EXAM: VS:  BP 100/60   Pulse 63   Ht 5\' 7"  (1.702 m)   Wt 143 lb 12 oz (65.2 kg)   SpO2 99%   BMI 22.51 kg/m  , BMI Body mass index is 22.51 kg/m. Affect appropriate Healthy:   appears stated age HEENT: normal Neck supple with no adenopathy JVP normal no bruits no thyromegaly Lungs clear with no wheezing and good diaphragmatic motion Heart:  S1/S2 no murmur, no rub, gallop or click PMI normal Abdomen: benighn, BS positve, no tenderness, no AAA no bruit.  No HSM or HJR Distal pulses intact with no bruits No edema Neuro non-focal Skin warm and dry No muscular weakness    EKG:  SR limb lead reversal LVH early repolarization    Recent Labs: 12/24/2017: BUN 9; Creatinine, Ser 1.26; Hemoglobin 13.9; Platelets 260; Potassium 4.0; Sodium 141; TSH 0.446    Lipid Panel No results found for: CHOL, TRIG, HDL, CHOLHDL, VLDL, LDLCALC, LDLDIRECT    Wt Readings from Last 3 Encounters:  12/26/17 143 lb 12 oz (65.2 kg)  12/06/16 135 lb (61.2 kg)  01/08/16 140 lb (63.5 kg)      Other studies Reviewed: Additional studies/ records that were reviewed today include: Notes ER , labs , CXR And ECG .    ASSESSMENT AND PLAN:  1.  Chest Pain  Atypical f/u stress echo  2.  GERD protonix low carb diet  3. Previous Smoker  Normal lung exam 4. Murmur:  History of I hear none today will have  baseline TTE during stress echo    Current medicines are reviewed at length with the patient today.  The patient does not have concerns regarding medicines.  The following changes have been made:  None   Labs/ tests ordered today include: stress echo  No orders of the defined types were placed in this encounter.    Disposition:   FU with cardiology PRN      Signed, Charlton Haws, MD  12/26/2017 10:01 AM    [facility] [address] [place], [place], [place]  [address] Phone: [phone]; Fax: [phone]

## 2017-12-26 ENCOUNTER — Encounter

## 2017-12-26 ENCOUNTER — Encounter: Payer: Self-pay | Admitting: Cardiovascular Disease

## 2017-12-26 ENCOUNTER — Ambulatory Visit (INDEPENDENT_AMBULATORY_CARE_PROVIDER_SITE_OTHER): Payer: Self-pay | Admitting: Cardiovascular Disease

## 2017-12-26 VITALS — BP 100/60 | HR 63 | Ht 67.0 in | Wt 143.8 lb

## 2017-12-26 DIAGNOSIS — R079 Chest pain, unspecified: Secondary | ICD-10-CM

## 2017-12-26 NOTE — Patient Instructions (Signed)
Medication Instructions:  Your physician recommends that you continue on your current medications as directed. Please refer to the Current Medication list given to you today.  Labwork: NONE  Testing/Procedures: Your physician has requested that you have an exercise tolerance test. For further information please visit [email or web address]. Please also follow instruction sheet, as given.  Follow-Up: Your physician wants you to follow-up as needed with Dr. Nishan.    If you need a refill on your cardiac medications before your next appointment, please call your pharmacy.    

## 2017-12-28 ENCOUNTER — Ambulatory Visit (INDEPENDENT_AMBULATORY_CARE_PROVIDER_SITE_OTHER): Payer: Self-pay

## 2017-12-28 DIAGNOSIS — R079 Chest pain, unspecified: Secondary | ICD-10-CM

## 2017-12-28 LAB — EXERCISE TOLERANCE TEST
CHL CUP MPHR: 192 {beats}/min
CHL RATE OF PERCEIVED EXERTION: 16
CSEPED: 12 min
CSEPEDS: 53 s
CSEPHR: 89 %
Estimated workload: 15 METS
Peak HR: 171 {beats}/min
Rest HR: 66 {beats}/min

## 2017-12-28 NOTE — Addendum Note (Signed)
Addended by: Virl AxePATE INGALLS, Nephi Savage L on: 12/28/2017 09:39 AM   Modules accepted: Orders

## 2018-01-03 ENCOUNTER — Telehealth: Payer: Self-pay | Admitting: Cardiovascular Disease

## 2018-01-03 NOTE — Telephone Encounter (Signed)
Called patient to let him know that as of right now his GXT was normal. Will wait for Dr. Eden EmmsNishan to review and see if patient still needs stress echo. Informed patient that if he does need a stress echo still, that we will schedule after he gets insurance coverage. Informed patient that Dr. Eden EmmsNishan will be back in the office next week. Will send message to Dr. Eden EmmsNishan for advisement.

## 2018-01-03 NOTE — Telephone Encounter (Signed)
New Message:  Patient called to find out why he has an order for a Echo Stress Test:  [facility] and was referred to contact Theodoro KosKaty Bowman not available   Please return call to patient .

## 2018-01-06 NOTE — Telephone Encounter (Signed)
No need for echo at this time. 

## 2018-01-08 NOTE — Telephone Encounter (Signed)
Patient aware that he does not need an echo.

## 2018-01-11 ENCOUNTER — Telehealth: Payer: Self-pay | Admitting: Cardiovascular Disease

## 2018-01-11 NOTE — Addendum Note (Signed)
Addended by: Virl AxePATE INGALLS, Darl Kuss L on: 01/11/2018 04:41 PM   Modules accepted: Orders

## 2018-01-11 NOTE — Telephone Encounter (Signed)
Left message for patient to call back  

## 2018-01-11 NOTE — Telephone Encounter (Signed)
Follow up:     Patient returning call, Patient stated that he has been told about 4 or 5 different times about what he should do about it. He dosnt know what he should be doing.

## 2018-08-30 ENCOUNTER — Ambulatory Visit (HOSPITAL_COMMUNITY)
Admission: EM | Admit: 2018-08-30 | Discharge: 2018-08-30 | Disposition: A | Discharge status: Home / Self Care | Payer: BLUE CROSS/BLUE SHIELD | Attending: Internal Medicine | Admitting: Internal Medicine

## 2018-08-30 ENCOUNTER — Other Ambulatory Visit: Payer: Self-pay

## 2018-08-30 ENCOUNTER — Encounter (HOSPITAL_COMMUNITY): Payer: Self-pay | Admitting: Emergency Medicine

## 2018-08-30 DIAGNOSIS — R300 Dysuria: Secondary | ICD-10-CM | POA: Diagnosis present

## 2018-08-30 LAB — POCT URINALYSIS DIP (DEVICE)
Bilirubin Urine: NEGATIVE
Glucose, UA: NEGATIVE mg/dL
Ketones, ur: NEGATIVE mg/dL
Leukocytes,Ua: NEGATIVE
Nitrite: NEGATIVE
Protein, ur: NEGATIVE mg/dL
Specific Gravity, Urine: 1.015 (ref 1.005–1.030)
Urobilinogen, UA: 0.2 mg/dL (ref 0.0–1.0)
pH: 7.5 (ref 5.0–8.0)

## 2018-08-30 NOTE — ED Triage Notes (Signed)
PT reports dysuria one week. Pt reports left groin lymph node is swollen.

## 2018-08-30 NOTE — ED Provider Notes (Signed)
[facility]    CSN: [id number] Arrival date & time: 08/30/18  1225     History   Chief Complaint Chief Complaint  Patient presents with  . Dysuria    HPI Calvin Larsen is a 28 y.o. male with no chronic medical problems comes to urgent care with complaints of dysuria of 1 week duration.  Patient says symptoms started 1 week ago and is been persistent.  No fever or chills.  No penile discharge.  No ulceration.  No nausea or vomiting.  No diarrhea.  Patient has 1 sexual partner and engages in unprotected sexual intercourse.  He complains of left groin pain with some swelling.  No tenderness or swelling in the testes.     Past Medical History:  Diagnosis Date  . Chest pain   . GERD (gastroesophageal reflux disease)   . Headache   . Palpitations   . SOB (shortness of breath)     There are no active problems to display for this patient.   Past Surgical History:  Procedure Laterality Date  . NO PAST SURGERIES         Home Medications    Prior to Admission medications   Not on File    Family History No family history on file.  Social History Social History   Tobacco Use  . Smoking status: Former Smoker    Packs/day: 1.00    Years: 3.00    Pack years: 3.00  . Smokeless tobacco: Never Used  Substance Use Topics  . Alcohol use: Yes    Alcohol/week: 0.0 standard drinks    Comment: occasionally  . Drug use: No     Allergies   Patient has no known allergies.   Review of Systems Review of Systems  Constitutional: Negative for activity change, appetite change, fatigue and fever.  HENT: Negative for postnasal drip, sinus pressure, sinus pain and sore throat.   Respiratory: Negative for cough, chest tightness and shortness of breath.   Cardiovascular: Negative for chest pain and palpitations.  Gastrointestinal: Negative for abdominal distention and abdominal pain.  Genitourinary: Positive for difficulty urinating and dysuria. Negative for  discharge, enuresis, genital sores, hematuria, penile pain, penile swelling, scrotal swelling, testicular pain and urgency.  Musculoskeletal: Negative.   Neurological: Negative.   All other systems reviewed and are negative.    Physical Exam Triage Vital Signs ED Triage Vitals  Enc Vitals Group     BP 08/30/18 1252 123/74     Pulse Rate 08/30/18 1252 61     Resp 08/30/18 1252 16     Temp 08/30/18 1252 98.4 F (36.9 C)     Temp Source 08/30/18 1252 Oral     SpO2 08/30/18 1252 100 %     Weight --      Height --      Head Circumference --      Peak Flow --      Pain Score 08/30/18 1253 0     Pain Loc --      Pain Edu? --      Excl. in GC? --    No data found.  Updated Vital Signs BP 123/74   Pulse 61   Temp 98.4 F (36.9 C) (Oral)   Resp 16   SpO2 100%   Visual Acuity Right Eye Distance:   Left Eye Distance:   Bilateral Distance:    Right Eye Near:   Left Eye Near:    Bilateral Near:     Physical Exam  Constitutional:      General: He is not in acute distress.    Appearance: He is not ill-appearing or toxic-appearing.  Eyes:     Conjunctiva/sclera: Conjunctivae normal.  Neck:     Musculoskeletal: Normal range of motion.  Cardiovascular:     Rate and Rhythm: Normal rate and regular rhythm.     Pulses: Normal pulses.     Heart sounds: Normal heart sounds.  Pulmonary:     Effort: Pulmonary effort is normal.     Breath sounds: Normal breath sounds.  Abdominal:     General: Abdomen is flat. Bowel sounds are normal.     Palpations: Abdomen is soft.  Genitourinary:    Penis: Normal.      Scrotum/Testes: Normal.     Comments: Left groin lymphadenopathy Musculoskeletal: Normal range of motion.  Skin:    General: Skin is warm.     Capillary Refill: Capillary refill takes less than 2 seconds.     Coloration: Skin is not jaundiced.     Findings: No bruising or lesion.  Neurological:     General: No focal deficit present.     Mental Status: He is alert and  oriented to person, place, and time.      UC Treatments / Results  Labs (all labs ordered are listed, but only abnormal results are displayed) Labs Reviewed  POCT URINALYSIS DIP (DEVICE) - Abnormal; Notable for the following components:      Result Value   Hgb urine dipstick TRACE (*)    All other components within normal limits  URINE CYTOLOGY ANCILLARY ONLY    EKG None  Radiology No results found.  Procedures Procedures (including critical care time)  Medications Ordered in UC Medications - No data to display  Initial Impression / Assessment and Plan / UC Course  I have reviewed the triage vital signs and the nursing notes.  Pertinent labs & imaging results that were available during my care of the patient were reviewed by me and considered in my medical decision making (see chart for details).     1.  Dysuria: Point-of-care urinalysis is negative for urinary tract infection GC/chlamydia has been sent Patient is counseled to increase his oral fluid intake Await GC chlamydia results before making any further recommendations. Final Clinical Impressions(s) / UC Diagnoses   Final diagnoses:  Dysuria   Discharge Instructions   None    ED Prescriptions    None     Controlled Substance Prescriptions [place] Controlled Substance Registry consulted? No   Merrilee JanskyLamptey, Gaston Dase O, MD 08/30/18 1343

## 2018-09-03 LAB — URINE CYTOLOGY ANCILLARY ONLY
Chlamydia: POSITIVE — AB
Neisseria Gonorrhea: NEGATIVE
Trichomonas: NEGATIVE

## 2018-09-04 ENCOUNTER — Telehealth (HOSPITAL_COMMUNITY): Payer: Self-pay | Admitting: Emergency Medicine

## 2018-09-04 MED ORDER — AZITHROMYCIN 250 MG PO TABS
1000.0000 mg | ORAL_TABLET | Freq: Once | ORAL | 0 refills | Status: AC
Start: 2018-09-04 — End: 2018-09-04

## 2018-09-04 NOTE — Telephone Encounter (Signed)
Chlamydia is positive.  Rx po zithromax 1g #1 dose no refills was sent to the pharmacy of record.  Pt needs education to please refrain from sexual intercourse for 7 days to give the medicine time to work, sexual partners need to be notified and tested/treated.  Condoms may reduce risk of reinfection.  Recheck or followup with PCP for further evaluation if symptoms are not improving.   [facility] notified.  Patient contacted and made aware of all results, all questions answered.    

## 2018-09-17 ENCOUNTER — Ambulatory Visit (INDEPENDENT_AMBULATORY_CARE_PROVIDER_SITE_OTHER): Payer: BLUE CROSS/BLUE SHIELD

## 2018-09-17 ENCOUNTER — Ambulatory Visit (HOSPITAL_COMMUNITY)
Admission: EM | Admit: 2018-09-17 | Discharge: 2018-09-17 | Disposition: A | Discharge status: Home / Self Care | Payer: BLUE CROSS/BLUE SHIELD | Attending: Family Medicine | Admitting: Family Medicine

## 2018-09-17 ENCOUNTER — Encounter (HOSPITAL_COMMUNITY): Payer: Self-pay | Admitting: Emergency Medicine

## 2018-09-17 DIAGNOSIS — R0789 Other chest pain: Secondary | ICD-10-CM

## 2018-09-17 MED ORDER — METHYLPREDNISOLONE 4 MG PO TBPK: ORAL_TABLET | ORAL | 0 refills | Status: DC

## 2018-09-17 NOTE — ED Provider Notes (Signed)
[facility]    CSN: [id number] Arrival date & time: 09/17/18  1440     History   Chief Complaint Chief Complaint  Patient presents with  . Chest Pain    HPI Calvin Larsen is a 28 y.o. male.   HPI  Patient is here for chest pain.  He has had multiple evaluations for chest pain before.  He admits that he thinks it is at least partially due to anxiety.  On this event he has chest pain on the left side of his chest.  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  The pain is worse since doing this.  It hurts with movement of his arms.  Hurts with twisting his torso.  Hurts with taking a deep breath.  No substernal pressure, no radiation, no shortness of breath or diaphoresis.  He has no known cardiac condition or CAD risk factor.  He does not smoke, does not drink to excess , no hypertension diabetes or hypercholesterol known.  Family history does reveal heart disease in older family members.  Past Medical History:  Diagnosis Date  . Chest pain   . GERD (gastroesophageal reflux disease)   . Headache   . Palpitations   . SOB (shortness of breath)     There are no active problems to display for this patient.   Past Surgical History:  Procedure Laterality Date  . NO PAST SURGERIES         Home Medications    Prior to Admission medications   Medication Sig Start Date End Date Taking? Authorizing Provider  methylPREDNISolone (MEDROL DOSEPAK) 4 MG TBPK tablet tad 09/17/18   Eustace Moore, MD    Family History No family history on file.  Social History Social History   Tobacco Use  . Smoking status: Former Smoker    Packs/day: 1.00    Years: 3.00    Pack years: 3.00  . Smokeless tobacco: Never Used  Substance Use Topics  . Alcohol use: Yes    Alcohol/week: 0.0 standard drinks    Comment: occasionally  . Drug use: No     Allergies   Patient has no known allergies.   Review of Systems Review of Systems   Constitutional: Negative for chills and fever.  HENT: Negative for ear pain and sore throat.   Eyes: Negative for pain and visual disturbance.  Respiratory: Negative for cough and shortness of breath.   Cardiovascular: Positive for chest pain. Negative for palpitations.       With respiration  Gastrointestinal: Negative for abdominal pain and vomiting.  Genitourinary: Negative for dysuria and hematuria.  Musculoskeletal: Negative for arthralgias and back pain.  Skin: Negative for color change and rash.  Neurological: Negative for seizures and syncope.  All other systems reviewed and are negative.    Physical Exam Triage Vital Signs ED Triage Vitals  Enc Vitals Group     BP 09/17/18 1452 118/62     Pulse Rate 09/17/18 1452 60     Resp 09/17/18 1452 16     Temp 09/17/18 1452 98.2 F (36.8 C)     Temp src --      SpO2 09/17/18 1452 100 %     Weight --      Height --      Head Circumference --      Peak Flow --      Pain Score 09/17/18 1454 8     Pain Loc --  Pain Edu? --      Excl. in GC? --    No data found.  Updated Vital Signs BP 118/62   Pulse 60   Temp 98.2 F (36.8 C)   Resp 16   SpO2 100%      Physical Exam Constitutional:      General: He is not in acute distress.    Appearance: He is well-developed.  HENT:     Head: Normocephalic and atraumatic.  Eyes:     Conjunctiva/sclera: Conjunctivae normal.     Pupils: Pupils are equal, round, and reactive to light.  Neck:     Musculoskeletal: Normal range of motion and neck supple.  Cardiovascular:     Rate and Rhythm: Normal rate and regular rhythm.     Heart sounds: Murmur present. Diastolic murmur present.  Pulmonary:     Effort: Pulmonary effort is normal. No respiratory distress.     Breath sounds: Normal breath sounds.  Abdominal:     General: Bowel sounds are normal. There is no distension.     Palpations: Abdomen is soft. There is no hepatomegaly or splenomegaly.  Musculoskeletal: Normal  range of motion.     Right lower leg: No edema.     Left lower leg: No edema.  Lymphadenopathy:     Cervical: No cervical adenopathy.  Skin:    General: Skin is warm and dry.  Neurological:     General: No focal deficit present.     Mental Status: He is alert.  Psychiatric:        Mood and Affect: Mood normal.        Behavior: Behavior normal.      UC Treatments / Results  Labs (all labs ordered are listed, but only abnormal results are displayed) Labs Reviewed - No data to display  EKG None  Radiology Dg Chest 2 View  Result Date: 09/17/2018 CLINICAL DATA:  Left-sided chest pain for the past week. EXAM: CHEST - 2 VIEW COMPARISON:  Chest x-ray dated December 24, 2017. FINDINGS: The heart size and mediastinal contours are within normal limits. Both lungs are clear. The visualized skeletal structures are unremarkable. IMPRESSION: No active cardiopulmonary disease. Electronically Signed   By: Obie Dredge M.D.   On: 09/17/2018 15:59    Procedures Procedures (including critical care time)  Medications Ordered in UC Medications - No data to display  Initial Impression / Assessment and Plan / UC Course  I have reviewed the triage vital signs and the nursing notes.  Pertinent labs & imaging results that were available during my care of the patient were reviewed by me and considered in my medical decision making (see chart for details).     EKG is unchanged from prior.  Chest x-ray is negative.  Discussed with patient he has musculoskeletal chest wall pain. Final Clinical Impressions(s) / UC Diagnoses   Final diagnoses:  Chest wall pain     Discharge Instructions     Your chest x-ray and your EKG are completely normal  Take the Medrol Dosepak as prescribed.  This is a prednisone/steroid which will take down pain and inflammation.  Take all of day 1 today.  (3 now, 3 at bedtime) When finished with the Medrol you can take ibuprofen for pain.  Take 800 mg 3 times a day  with food Decreased activity while chest is hurting.  I expect improvement over the next couple of days Call to make an appointment with her primary care doctor.  ED Prescriptions    Medication Sig Dispense Auth. Provider   methylPREDNISolone (MEDROL DOSEPAK) 4 MG TBPK tablet tad 21 tablet Eustace MooreNelson, Jillana Selph Sue, MD     Controlled Substance Prescriptions [place] Controlled Substance Registry consulted? Not Applicable   Eustace MooreNelson, Comfort Iversen Sue, MD 09/17/18 2041

## 2018-09-17 NOTE — Discharge Instructions (Addendum)
Your chest x-ray and your EKG are completely normal  Take the Medrol Dosepak as prescribed.  This is a prednisone/steroid which will take down pain and inflammation.  Take all of day 1 today.  (3 now, 3 at bedtime) When finished with the Medrol you can take ibuprofen for pain.  Take 800 mg 3 times a day with food Decreased activity while chest is hurting.  I expect improvement over the next couple of days Call to make an appointment with her primary care doctor.

## 2018-09-17 NOTE — ED Triage Notes (Signed)
Pt c/o L sided chest pain for the last few days, states it got worse today, states pain is worse with taking a deep breath, he can feel it if he twists his body.

## 2018-09-21 ENCOUNTER — Ambulatory Visit (HOSPITAL_COMMUNITY)
Admission: EM | Admit: 2018-09-21 | Discharge: 2018-09-21 | Disposition: A | Discharge status: Home / Self Care | Payer: BLUE CROSS/BLUE SHIELD | Attending: Family Medicine | Admitting: Family Medicine

## 2018-09-21 ENCOUNTER — Encounter (HOSPITAL_COMMUNITY): Payer: Self-pay | Admitting: Emergency Medicine

## 2018-09-21 ENCOUNTER — Other Ambulatory Visit: Payer: Self-pay

## 2018-09-21 DIAGNOSIS — Z7689 Persons encountering health services in other specified circumstances: Secondary | ICD-10-CM

## 2018-09-21 DIAGNOSIS — R0789 Other chest pain: Secondary | ICD-10-CM | POA: Diagnosis not present

## 2018-09-21 DIAGNOSIS — Z0289 Encounter for other administrative examinations: Secondary | ICD-10-CM | POA: Diagnosis not present

## 2018-09-21 NOTE — ED Provider Notes (Signed)
[facility]    CSN: [id number] Arrival date & time: 09/21/18  1052     History   Chief Complaint Chief Complaint  Patient presents with  . Follow-up  . Letter for School/Work    HPI Babak Terrilee CroakKnight is a 28 y.o. male.   Patient is a 28 year old male with past medical history of chest pain, GERD, headache, palpitations, shortness of breath.  He presents today for recheck and note to return to work.  He was seen here on Monday and had normal chest x-ray and EKG.  Currently being treated for chest wall pain.  Reports that his symptoms have improved.  He is wanting a work note to return on Sunday.  Rating pain at 3 right now but denies any associated palpitations or shortness of breath.  No cough, congestion or fevers. ROS per HPI      Past Medical History:  Diagnosis Date  . Chest pain   . GERD (gastroesophageal reflux disease)   . Headache   . Palpitations   . SOB (shortness of breath)     There are no active problems to display for this patient.   Past Surgical History:  Procedure Laterality Date  . NO PAST SURGERIES         Home Medications    Prior to Admission medications   Medication Sig Start Date End Date Taking? Authorizing Provider  methylPREDNISolone (MEDROL DOSEPAK) 4 MG TBPK tablet tad 09/17/18   Eustace MooreNelson, Yvonne Sue, MD    Family History History reviewed. No pertinent family history.  Social History Social History   Tobacco Use  . Smoking status: Former Smoker    Packs/day: 1.00    Years: 3.00    Pack years: 3.00  . Smokeless tobacco: Never Used  Substance Use Topics  . Alcohol use: Yes    Alcohol/week: 0.0 standard drinks    Comment: occasionally  . Drug use: No     Allergies   Patient has no known allergies.   Review of Systems Review of Systems   Physical Exam Triage Vital Signs ED Triage Vitals  Enc Vitals Group     BP 09/21/18 1114 133/73     Pulse Rate 09/21/18 1114 64     Resp 09/21/18 1114 18     Temp  09/21/18 1114 98.3 F (36.8 C)     Temp Source 09/21/18 1114 Oral     SpO2 09/21/18 1114 100 %     Weight --      Height --      Head Circumference --      Peak Flow --      Pain Score 09/21/18 1115 3     Pain Loc --      Pain Edu? --      Excl. in GC? --    No data found.  Updated Vital Signs BP 133/73 (BP Location: Right Arm)   Pulse 64   Temp 98.3 F (36.8 C) (Oral)   Resp 18   SpO2 100%   Visual Acuity Right Eye Distance:   Left Eye Distance:   Bilateral Distance:    Right Eye Near:   Left Eye Near:    Bilateral Near:     Physical Exam Vitals signs and nursing note reviewed.  Constitutional:      Appearance: He is well-developed.  HENT:     Head: Normocephalic and atraumatic.  Eyes:     Conjunctiva/sclera: Conjunctivae normal.  Neck:     Musculoskeletal: Neck supple.  Cardiovascular:     Rate and Rhythm: Normal rate and regular rhythm.     Heart sounds: No murmur.  Pulmonary:     Effort: Pulmonary effort is normal. No respiratory distress.     Breath sounds: Normal breath sounds.  Skin:    General: Skin is warm and dry.  Neurological:     Mental Status: He is alert.      UC Treatments / Results  Labs (all labs ordered are listed, but only abnormal results are displayed) Labs Reviewed - No data to display  EKG None  Radiology No results found.  Procedures Procedures (including critical care time)  Medications Ordered in UC Medications - No data to display  Initial Impression / Assessment and Plan / UC Course  I have reviewed the triage vital signs and the nursing notes.  Pertinent labs & imaging results that were available during my care of the patient were reviewed by me and considered in my medical decision making (see chart for details).     Return to work note.   Patient appears well.  I believe that is safe for him to return to work since his symptoms have improved.  No need for further work-up. Follow up as needed for  continued or worsening symptoms  Final Clinical Impressions(s) / UC Diagnoses   Final diagnoses:  Return to work evaluation     Discharge Instructions     I felt that it is safe for you to return to work on Sunday since your  symptoms have improved If you need any more work extension from this point or when you will need to follow-up with a primary care provider to do FMLA paperwork we do not do that here in urgent care    ED Prescriptions    None     Controlled Substance Prescriptions [place] Controlled Substance Registry consulted? Not Applicable   Janace Aris, NP 09/21/18 1217

## 2018-09-21 NOTE — ED Triage Notes (Signed)
Pt here for pain in left sided rib chest area; pt was seen here Monday; pt sts some improvement but requesting another work note

## 2018-09-21 NOTE — Discharge Instructions (Addendum)
I felt that it is safe for you to return to work on Sunday since your  symptoms have improved If you need any more work extension from this point or when you will need to follow-up with a primary care provider to do FMLA paperwork we do not do that here in urgent care

## 2018-12-31 ENCOUNTER — Encounter (HOSPITAL_COMMUNITY): Payer: Self-pay

## 2018-12-31 ENCOUNTER — Ambulatory Visit (HOSPITAL_COMMUNITY)
Admission: EM | Admit: 2018-12-31 | Discharge: 2018-12-31 | Disposition: A | Discharge status: Home / Self Care | Payer: BC Managed Care – PPO | Attending: Emergency Medicine | Admitting: Emergency Medicine

## 2018-12-31 ENCOUNTER — Other Ambulatory Visit: Payer: Self-pay

## 2018-12-31 DIAGNOSIS — R51 Headache: Secondary | ICD-10-CM | POA: Diagnosis present

## 2018-12-31 DIAGNOSIS — R6889 Other general symptoms and signs: Secondary | ICD-10-CM | POA: Insufficient documentation

## 2018-12-31 DIAGNOSIS — Z20828 Contact with and (suspected) exposure to other viral communicable diseases: Secondary | ICD-10-CM

## 2018-12-31 DIAGNOSIS — R519 Headache, unspecified: Secondary | ICD-10-CM

## 2018-12-31 DIAGNOSIS — Z20822 Contact with and (suspected) exposure to covid-19: Secondary | ICD-10-CM

## 2018-12-31 NOTE — ED Triage Notes (Signed)
Pt presents for Covid test after exposure at work; pt states he is not having any symptoms.

## 2018-12-31 NOTE — ED Provider Notes (Signed)
[facility]    CSN: [id number] Arrival date & time: 12/31/18  1540     History   Chief Complaint Chief Complaint  Patient presents with  . Covid Exposure    HPI Calvin Larsen is a 28 y.o. male.   Patient presents with request for COVID test.  He has a mild headache but states this is not unusual as he works in front of a computer all day.  He denies fever, chills, sore throat, cough, shortness of breath, abdominal pain, vomiting, diarrhea, or other symptoms.  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  The history is provided by the patient.    Past Medical History:  Diagnosis Date  . Chest pain   . GERD (gastroesophageal reflux disease)   . Headache   . Palpitations   . SOB (shortness of breath)     There are no active problems to display for this patient.   Past Surgical History:  Procedure Laterality Date  . NO PAST SURGERIES         Home Medications    Prior to Admission medications   Medication Sig Start Date End Date Taking? Authorizing Provider  methylPREDNISolone (MEDROL DOSEPAK) 4 MG TBPK tablet tad 09/17/18   Eustace MooreNelson, Yvonne Sue, MD    Family History History reviewed. No pertinent family history.  Social History Social History   Tobacco Use  . Smoking status: Former Smoker    Packs/day: 1.00    Years: 3.00    Pack years: 3.00  . Smokeless tobacco: Never Used  Substance Use Topics  . Alcohol use: Yes    Alcohol/week: 0.0 standard drinks    Comment: occasionally  . Drug use: No     Allergies   Patient has no known allergies.   Review of Systems Review of Systems  Constitutional: Negative for chills and fever.  HENT: Negative for congestion, ear pain, rhinorrhea and sore throat.   Eyes: Negative for pain and visual disturbance.  Respiratory: Negative for cough and shortness of breath.   Cardiovascular: Negative for chest pain and palpitations.  Gastrointestinal: Negative for abdominal pain, diarrhea  and vomiting.  Genitourinary: Negative for dysuria and hematuria.  Musculoskeletal: Negative for arthralgias and back pain.  Skin: Negative for color change and rash.  Neurological: Positive for headaches. Negative for dizziness, seizures, syncope, facial asymmetry, speech difficulty, weakness, light-headedness and numbness.  All other systems reviewed and are negative.    Physical Exam Triage Vital Signs ED Triage Vitals  Enc Vitals Group     BP      Pulse      Resp      Temp      Temp src      SpO2      Weight      Height      Head Circumference      Peak Flow      Pain Score      Pain Loc      Pain Edu?      Excl. in GC?    No data found.  Updated Vital Signs BP 116/68 (BP Location: Left Arm)   Pulse (!) 57   Temp 98.4 F (36.9 C) (Oral)   Resp 17   SpO2 100%   Visual Acuity Right Eye Distance:   Left Eye Distance:   Bilateral Distance:    Right Eye Near:   Left Eye Near:    Bilateral Near:     Physical  Exam Vitals signs and nursing note reviewed.  Constitutional:      Appearance: He is well-developed.  HENT:     Head: Normocephalic and atraumatic.     Right Ear: Tympanic membrane normal.     Left Ear: Tympanic membrane normal.     Mouth/Throat:     Mouth: Mucous membranes are moist.     Pharynx: Oropharynx is clear.  Eyes:     Conjunctiva/sclera: Conjunctivae normal.  Neck:     Musculoskeletal: Neck supple.  Cardiovascular:     Rate and Rhythm: Normal rate and regular rhythm.     Heart sounds: No murmur.  Pulmonary:     Effort: Pulmonary effort is normal. No respiratory distress.     Breath sounds: Normal breath sounds.  Abdominal:     Palpations: Abdomen is soft.     Tenderness: There is no abdominal tenderness. There is no guarding or rebound.  Skin:    General: Skin is warm and dry.     Findings: No rash.  Neurological:     General: No focal deficit present.     Mental Status: He is alert and oriented to person, place, and time.      Cranial Nerves: No cranial nerve deficit.     Sensory: No sensory deficit.     Motor: No weakness.     Coordination: Coordination normal.     Gait: Gait normal.     Deep Tendon Reflexes: Reflexes normal.      UC Treatments / Results  Labs (all labs ordered are listed, but only abnormal results are displayed) Labs Reviewed  NOVEL CORONAVIRUS, NAA (HOSPITAL ORDER, SEND-OUT TO REF LAB)    EKG   Radiology No results found.  Procedures Procedures (including critical care time)  Medications Ordered in UC Medications - No data to display  Initial Impression / Assessment and Plan / UC Course  I have reviewed the triage vital signs and the nursing notes.  Pertinent labs & imaging results that were available during my care of the patient were reviewed by me and considered in my medical decision making (see chart for details).   Headache, suspected COVID.  COVID test performed here.  Discussed with patient that he should self quarantine until his test result is back and negative.  Instructed patient to go to the emergency department if he develops shortness of breath, severe diarrhea, high fever, or worsening headache.     Final Clinical Impressions(s) / UC Diagnoses   Final diagnoses:  Acute nonintractable headache, unspecified headache type  Suspected Covid-19 Virus Infection     Discharge Instructions     Your COVID test is pending.  You should self quarantine until your test result is back and is negative.    Go to the emergency department if you develop shortness of breath, high fever, severe diarrhea, or other concerning symptoms.         ED Prescriptions    None     Controlled Substance Prescriptions [place] Controlled Substance Registry consulted? Not Applicable   Sharion Balloon, NP 12/31/18 [phone]

## 2018-12-31 NOTE — Discharge Instructions (Addendum)
Your COVID test is pending.  You should self quarantine until your test result is back and is negative.   ° °Go to the emergency department if you develop shortness of breath, high fever, severe diarrhea, or other concerning symptoms.   ° ° ° °

## 2019-01-02 LAB — NOVEL CORONAVIRUS, NAA (HOSP ORDER, SEND-OUT TO REF LAB; TAT 18-24 HRS): SARS-CoV-2, NAA: NOT DETECTED

## 2019-01-04 ENCOUNTER — Encounter (HOSPITAL_COMMUNITY): Payer: Self-pay

## 2019-01-06 ENCOUNTER — Ambulatory Visit (HOSPITAL_COMMUNITY)
Admission: EM | Admit: 2019-01-06 | Discharge: 2019-01-06 | Disposition: A | Discharge status: Home / Self Care | Payer: BC Managed Care – PPO | Attending: Family Medicine | Admitting: Family Medicine

## 2019-01-06 ENCOUNTER — Encounter (HOSPITAL_COMMUNITY): Payer: Self-pay | Admitting: Emergency Medicine

## 2019-01-06 ENCOUNTER — Other Ambulatory Visit: Payer: Self-pay

## 2019-01-06 DIAGNOSIS — Z87891 Personal history of nicotine dependence: Secondary | ICD-10-CM | POA: Diagnosis not present

## 2019-01-06 DIAGNOSIS — Z20828 Contact with and (suspected) exposure to other viral communicable diseases: Secondary | ICD-10-CM | POA: Insufficient documentation

## 2019-01-06 DIAGNOSIS — R0989 Other specified symptoms and signs involving the circulatory and respiratory systems: Secondary | ICD-10-CM | POA: Diagnosis not present

## 2019-01-06 DIAGNOSIS — R6889 Other general symptoms and signs: Secondary | ICD-10-CM

## 2019-01-06 DIAGNOSIS — R51 Headache: Secondary | ICD-10-CM | POA: Insufficient documentation

## 2019-01-06 DIAGNOSIS — J029 Acute pharyngitis, unspecified: Secondary | ICD-10-CM | POA: Insufficient documentation

## 2019-01-06 DIAGNOSIS — Z20822 Contact with and (suspected) exposure to covid-19: Secondary | ICD-10-CM

## 2019-01-06 DIAGNOSIS — R509 Fever, unspecified: Secondary | ICD-10-CM | POA: Diagnosis not present

## 2019-01-06 DIAGNOSIS — R5383 Other fatigue: Secondary | ICD-10-CM | POA: Diagnosis not present

## 2019-01-06 NOTE — ED Provider Notes (Signed)
[place]    CSN: [id number] Arrival date & time: 01/06/19  1349      History   Chief Complaint Chief Complaint  Patient presents with  . Headache    HPI Calvin Larsen is a 28 y.o. male.   HPI  Patient was here at 12/31/2018 for cover test.  He had been exposed to [place] from a coworker.  He states that at that time he was feeling well.  He does 1 of the tests.  The result came back on 01/04/2019.  It was negative Since that time he has started feeling poorly.  For the last 2 days he has had body aches, fatigue, fever and chills, headache.  Mild sore throat and runny nose.  He states that his kids have a cold and he feels like he may just have their virus, however, he would like to have another COVID-19 test prior to returning to work. He states he gets headaches a lot.  He states he gets headaches when he uses a computer for long period of time.  He is advised to see an eye doctor for testing.  Past Medical History:  Diagnosis Date  . Chest pain   . GERD (gastroesophageal reflux disease)   . Headache   . Palpitations   . SOB (shortness of breath)     There are no active problems to display for this patient.   Past Surgical History:  Procedure Laterality Date  . NO PAST SURGERIES         Home Medications    Prior to Admission medications   Not on File    Family History No family history on file.  Social History Social History   Tobacco Use  . Smoking status: Former Smoker    Packs/day: 1.00    Years: 3.00    Pack years: 3.00  . Smokeless tobacco: Never Used  Substance Use Topics  . Alcohol use: Yes    Alcohol/week: 0.0 standard drinks    Comment: occasionally  . Drug use: No     Allergies   Patient has no known allergies.   Review of Systems Review of Systems  Constitutional: Positive for chills, fatigue and fever. Negative for appetite change.  HENT: Positive for congestion, rhinorrhea and sore throat. Negative for ear pain.    Eyes: Negative for pain and visual disturbance.  Respiratory: Negative for cough and shortness of breath.   Cardiovascular: Negative for chest pain and palpitations.  Gastrointestinal: Negative for abdominal pain, nausea and vomiting.  Genitourinary: Negative for dysuria and hematuria.  Musculoskeletal: Negative for arthralgias and back pain.  Skin: Negative for color change and rash.  Neurological: Positive for headaches. Negative for seizures and syncope.  All other systems reviewed and are negative.    Physical Exam Triage Vital Signs ED Triage Vitals [01/06/19 1509]  Enc Vitals Group     BP 110/73     Pulse Rate 79     Resp 16     Temp 98.7 F (37.1 C)     Temp src      SpO2 100 %     Weight      Height      Head Circumference      Peak Flow      Pain Score 4     Pain Loc      Pain Edu?      Excl. in [place]?    No data found.  Updated Vital Signs BP 110/73  Pulse 79   Temp 98.7 F (37.1 C)   Resp 16   SpO2 100%      Physical Exam Constitutional:      General: He is not in acute distress.    Appearance: He is well-developed and normal weight.  HENT:     Head: Normocephalic and atraumatic.     Mouth/Throat:     Mouth: Mucous membranes are moist.  Eyes:     General: No visual field deficit.    Extraocular Movements: Extraocular movements intact.     Conjunctiva/sclera: Conjunctivae normal.     Pupils: Pupils are equal, round, and reactive to light.  Neck:     Musculoskeletal: Normal range of motion and neck supple.  Cardiovascular:     Rate and Rhythm: Normal rate and regular rhythm.     Heart sounds: Normal heart sounds.  Pulmonary:     Effort: Pulmonary effort is normal. No respiratory distress.     Breath sounds: Normal breath sounds.  Abdominal:     General: There is no distension.     Palpations: Abdomen is soft.  Musculoskeletal: Normal range of motion.        General: No swelling or tenderness.  Lymphadenopathy:     Cervical: No cervical  adenopathy.  Skin:    General: Skin is warm and dry.  Neurological:     Mental Status: He is alert.     Cranial Nerves: No cranial nerve deficit, dysarthria or facial asymmetry.     Motor: No weakness.     Coordination: Romberg sign negative. Coordination normal.     Gait: Gait normal.     Deep Tendon Reflexes: Reflexes normal.  Psychiatric:        Mood and Affect: Mood normal.        Behavior: Behavior normal.      UC Treatments / Results  Labs (all labs ordered are listed, but only abnormal results are displayed) Labs Reviewed  NOVEL CORONAVIRUS, NAA (HOSPITAL ORDER, SEND-OUT TO REF LAB)    EKG   Radiology No results found.  Procedures Procedures (including critical care time)  Medications Ordered in UC Medications - No data to display  Initial Impression / Assessment and Plan / UC Course  I have reviewed the triage vital signs and the nursing notes.  Pertinent labs & imaging results that were available during my care of the patient were reviewed by me and considered in my medical decision making (see chart for details).     I talked to the patient about coronavirus infection.  The importance of quarantine pending his test results.  He may just have a cold from his children, but he needs to isolate from the family as well as his workplace, and stay home until his test result is available. Final Clinical Impressions(s) / UC Diagnoses   Final diagnoses:  Suspected Covid-19 Virus Infection     Discharge Instructions     Take tylenol (acetaminophen) for pain and fever Avoid taking more than 4000 mg a day May take OTC cough and cold medicine Read the COVID instructions CALL for questions or problems    ED Prescriptions    None     Controlled Substance Prescriptions [place] Controlled Substance Registry consulted? Not Applicable   Eustace MooreNelson,  Sue, MD 01/06/19 [phone]

## 2019-01-06 NOTE — Discharge Instructions (Signed)
Take tylenol (acetaminophen) for pain and fever Avoid taking more than 4000 mg a day May take OTC cough and cold medicine Read the COVID instructions CALL for questions or problems

## 2019-01-06 NOTE — ED Triage Notes (Signed)
Pt c/o congestion, headaches, body aches for the last two days, requesting covid testing.

## 2019-01-07 LAB — NOVEL CORONAVIRUS, NAA (HOSP ORDER, SEND-OUT TO REF LAB; TAT 18-24 HRS): SARS-CoV-2, NAA: NOT DETECTED

## 2019-01-09 ENCOUNTER — Encounter (HOSPITAL_COMMUNITY): Payer: Self-pay

## 2019-04-25 ENCOUNTER — Other Ambulatory Visit: Payer: Self-pay

## 2019-04-25 DIAGNOSIS — Z20822 Contact with and (suspected) exposure to covid-19: Secondary | ICD-10-CM

## 2019-04-28 LAB — NOVEL CORONAVIRUS, NAA: SARS-CoV-2, NAA: NOT DETECTED

## 2019-05-28 ENCOUNTER — Ambulatory Visit (HOSPITAL_COMMUNITY)
Admission: EM | Admit: 2019-05-28 | Discharge: 2019-05-28 | Disposition: A | Discharge status: Home / Self Care | Payer: BC Managed Care – PPO | Attending: Physician Assistant | Admitting: Physician Assistant

## 2019-05-28 ENCOUNTER — Encounter (HOSPITAL_COMMUNITY): Payer: Self-pay | Admitting: Emergency Medicine

## 2019-05-28 ENCOUNTER — Other Ambulatory Visit: Payer: Self-pay

## 2019-05-28 DIAGNOSIS — R300 Dysuria: Secondary | ICD-10-CM | POA: Insufficient documentation

## 2019-05-28 DIAGNOSIS — Z113 Encounter for screening for infections with a predominantly sexual mode of transmission: Secondary | ICD-10-CM

## 2019-05-28 LAB — POCT URINALYSIS DIP (DEVICE)
Bilirubin Urine: NEGATIVE
Glucose, UA: NEGATIVE mg/dL
Ketones, ur: NEGATIVE mg/dL
Leukocytes,Ua: NEGATIVE
Nitrite: NEGATIVE
Protein, ur: NEGATIVE mg/dL
Specific Gravity, Urine: 1.025 (ref 1.005–1.030)
Urobilinogen, UA: 0.2 mg/dL (ref 0.0–1.0)
pH: 7.5 (ref 5.0–8.0)

## 2019-05-28 LAB — HIV ANTIBODY (ROUTINE TESTING W REFLEX): HIV Screen 4th Generation wRfx: NONREACTIVE

## 2019-05-28 NOTE — Discharge Instructions (Signed)
We have sent out the tests you requested.   We will notify you of any positive results and help arrange treatment.   If you have worsening of symptoms to include worsening pain or onset of discharge please come back in for treatment.   Drink plenty of water.  Avoid sexual intercourse until the results of these tests come back.

## 2019-05-28 NOTE — ED Triage Notes (Signed)
Pt reports two weeks of burning with urination.  Pt concerned for chlamydia.

## 2019-05-28 NOTE — ED Provider Notes (Signed)
[facility]    CSN: [id number] Arrival date & time: 05/28/19  1414      History   Chief Complaint Chief Complaint  Patient presents with  . Dysuria    HPI Calvin Larsen is a 29 y.o. male.   Patient reports to urgent care today for 2 weeks of painful urination. Patient reports on an off pain with urinating. Pain is not severe and at times is not present when urinating. He notices that if he had not drank as much water, he may have more pain with urinating. He denies penile discharge and abdominal pain. He reports a new sexual partner from 2 weeks ago, of which he did not use condoms with. He does not know if this person has any sexually transmitted infections. He does have a history of chlamydia in 08/2018, that was treated. He reports pain with urination was worse then.   He would like to be tested for STIs      Past Medical History:  Diagnosis Date  . Chest pain   . GERD (gastroesophageal reflux disease)   . Headache   . Palpitations   . SOB (shortness of breath)     There are no problems to display for this patient.   Past Surgical History:  Procedure Laterality Date  . NO PAST SURGERIES         Home Medications    Prior to Admission medications   Not on File    Family History Family History  Problem Relation Age of Onset  . Healthy Mother   . Healthy Father   . Healthy Sister   . Healthy Brother     Social History Social History   Tobacco Use  . Smoking status: Former Smoker    Packs/day: 1.00    Years: 3.00    Pack years: 3.00  . Smokeless tobacco: Never Used  Substance Use Topics  . Alcohol use: Yes    Alcohol/week: 0.0 standard drinks    Comment: occasionally  . Drug use: No     Allergies   Patient has no known allergies.   Review of Systems Review of Systems  Constitutional: Negative for chills and fever.  Gastrointestinal: Negative for abdominal pain.  Genitourinary: Positive for dysuria. Negative for decreased  urine volume, difficulty urinating, discharge, flank pain, frequency, genital sores, hematuria, penile pain, penile swelling, scrotal swelling, testicular pain and urgency.  Musculoskeletal: Negative for back pain.     Physical Exam Triage Vital Signs ED Triage Vitals [05/28/19 1548]  Enc Vitals Group     BP      Pulse      Resp      Temp      Temp src      SpO2      Weight      Height      Head Circumference      Peak Flow      Pain Score 3     Pain Loc      Pain Edu?      Excl. in [place]?    No data found.  Updated Vital Signs BP 118/70 (BP Location: Right Arm)   Pulse (!) 52   Temp 98.5 F (36.9 C) (Oral)   Resp 12   SpO2 100%   Visual Acuity Right Eye Distance:   Left Eye Distance:   Bilateral Distance:    Right Eye Near:   Left Eye Near:    Bilateral Near:     Physical  Exam Vitals and nursing note reviewed.  Constitutional:      General: He is not in acute distress.    Appearance: Normal appearance. He is well-developed and normal weight. He is not ill-appearing.  HENT:     Head: Normocephalic and atraumatic.  Eyes:     General: No scleral icterus.       Right eye: No discharge.        Left eye: No discharge.     Conjunctiva/sclera: Conjunctivae normal.     Pupils: Pupils are equal, round, and reactive to light.  Cardiovascular:     Rate and Rhythm: Normal rate.  Pulmonary:     Effort: Pulmonary effort is normal. No respiratory distress.  Abdominal:     Palpations: Abdomen is soft.     Tenderness: There is no abdominal tenderness. There is no right CVA tenderness or left CVA tenderness.  Genitourinary:    Penis: Normal.      Testes: Normal.     Comments: No discharge Skin:    General: Skin is warm and dry.  Neurological:     General: No focal deficit present.     Mental Status: He is alert and oriented to person, place, and time.  Psychiatric:        Mood and Affect: Mood normal.        Behavior: Behavior normal.        Thought Content:  Thought content normal.        Judgment: Judgment normal.      UC Treatments / Results  Labs (all labs ordered are listed, but only abnormal results are displayed) Labs Reviewed  POCT URINALYSIS DIP (DEVICE) - Abnormal; Notable for the following components:      Result Value   Hgb urine dipstick TRACE (*)    All other components within normal limits  RPR  HIV ANTIBODY (ROUTINE TESTING W REFLEX)  CYTOLOGY, (ORAL, ANAL, URETHRAL) ANCILLARY ONLY    EKG   Radiology No results found.  Procedures Procedures (including critical care time)  Medications Ordered in UC Medications - No data to display  Initial Impression / Assessment and Plan / UC Course  I have reviewed the triage vital signs and the nursing notes.  Pertinent labs & imaging results that were available during my care of the patient were reviewed by me and considered in my medical decision making (see chart for details).     #Dysuria - UA without evidence of gross infection. Swabs sent for GC/CT/Trich. HIV/RPR sent. No known exposure and no discharge, discussed waiting until results for treatment. Patient agrees and seems reliable for follow up. Verified phone number.    Final Clinical Impressions(s) / UC Diagnoses   Final diagnoses:  Dysuria     Discharge Instructions     We have sent out the tests you requested.   We will notify you of any positive results and help arrange treatment.   If you have worsening of symptoms to include worsening pain or onset of discharge please come back in for treatment.   Drink plenty of water.  Avoid sexual intercourse until the results of these tests come back.      ED Prescriptions    None     PDMP not reviewed this encounter.   Purnell Shoemaker, PA-C 05/28/19 1650

## 2019-05-29 LAB — CYTOLOGY, (ORAL, ANAL, URETHRAL) ANCILLARY ONLY
Chlamydia: NEGATIVE
Neisseria Gonorrhea: NEGATIVE
Trichomonas: NEGATIVE

## 2019-12-01 ENCOUNTER — Other Ambulatory Visit: Payer: Self-pay

## 2019-12-01 ENCOUNTER — Encounter (HOSPITAL_COMMUNITY): Payer: Self-pay | Admitting: Emergency Medicine

## 2019-12-01 ENCOUNTER — Emergency Department (HOSPITAL_COMMUNITY)
Admission: EM | Admit: 2019-12-01 | Discharge: 2019-12-02 | Disposition: A | Discharge status: Home / Self Care | Payer: Managed Care, Other (non HMO) | Attending: Emergency Medicine | Admitting: Emergency Medicine

## 2019-12-01 DIAGNOSIS — M25512 Pain in left shoulder: Secondary | ICD-10-CM | POA: Insufficient documentation

## 2019-12-01 DIAGNOSIS — Z87891 Personal history of nicotine dependence: Secondary | ICD-10-CM | POA: Diagnosis not present
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
## 2019-12-02 ENCOUNTER — Encounter (HOSPITAL_COMMUNITY): Payer: Self-pay | Admitting: Student

## 2019-12-02 ENCOUNTER — Emergency Department (HOSPITAL_COMMUNITY): Payer: Managed Care, Other (non HMO)

## 2019-12-02 MED ORDER — METHOCARBAMOL 500 MG PO TABS: 500.0000 mg | ORAL_TABLET | Freq: Three times a day (TID) | ORAL | 0 refills | Status: DC | PRN

## 2019-12-02 MED ORDER — NAPROXEN 500 MG PO TABS: 500.0000 mg | ORAL_TABLET | Freq: Two times a day (BID) | ORAL | 0 refills | Status: DC | PRN
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
## 2019-12-10 ENCOUNTER — Other Ambulatory Visit: Payer: Self-pay

## 2019-12-10 ENCOUNTER — Emergency Department (HOSPITAL_COMMUNITY)
Admission: EM | Admit: 2019-12-10 | Discharge: 2019-12-11 | Disposition: A | Discharge status: Home / Self Care | Payer: Managed Care, Other (non HMO) | Attending: Emergency Medicine | Admitting: Emergency Medicine

## 2019-12-10 ENCOUNTER — Encounter (HOSPITAL_COMMUNITY): Payer: Self-pay

## 2019-12-10 DIAGNOSIS — Z5321 Procedure and treatment not carried out due to patient leaving prior to being seen by health care provider: Secondary | ICD-10-CM | POA: Insufficient documentation

## 2019-12-10 DIAGNOSIS — R519 Headache, unspecified: Secondary | ICD-10-CM | POA: Diagnosis not present
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
## 2019-12-11 ENCOUNTER — Emergency Department (HOSPITAL_COMMUNITY): Payer: Managed Care, Other (non HMO)

## 2019-12-11 NOTE — ED Notes (Signed)
Pt called from triage with no answer 

## 2020-01-13 LAB — RPR: RPR Ser Ql: NONREACTIVE — AB

## 2020-12-23 DIAGNOSIS — H5213 Myopia, bilateral: Secondary | ICD-10-CM | POA: Diagnosis not present

## 2021-01-19 DIAGNOSIS — Z0000 Encounter for general adult medical examination without abnormal findings: Secondary | ICD-10-CM | POA: Diagnosis not present

## 2021-01-19 DIAGNOSIS — H5789 Other specified disorders of eye and adnexa: Secondary | ICD-10-CM | POA: Diagnosis not present

## 2021-01-19 DIAGNOSIS — R319 Hematuria, unspecified: Secondary | ICD-10-CM | POA: Diagnosis not present

## 2021-01-19 DIAGNOSIS — R7309 Other abnormal glucose: Secondary | ICD-10-CM | POA: Diagnosis not present
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
## 2022-01-21 DIAGNOSIS — Z0000 Encounter for general adult medical examination without abnormal findings: Secondary | ICD-10-CM | POA: Diagnosis not present

## 2022-01-21 DIAGNOSIS — Z23 Encounter for immunization: Secondary | ICD-10-CM | POA: Diagnosis not present

## 2022-02-04 DIAGNOSIS — Z13220 Encounter for screening for lipoid disorders: Secondary | ICD-10-CM | POA: Diagnosis not present

## 2022-02-04 DIAGNOSIS — Z79899 Other long term (current) drug therapy: Secondary | ICD-10-CM | POA: Diagnosis not present

## 2022-02-04 DIAGNOSIS — R7309 Other abnormal glucose: Secondary | ICD-10-CM | POA: Diagnosis not present

## 2022-03-10 DIAGNOSIS — F411 Generalized anxiety disorder: Secondary | ICD-10-CM | POA: Diagnosis not present

## 2022-03-25 ENCOUNTER — Encounter (HOSPITAL_COMMUNITY): Payer: Self-pay | Admitting: Emergency Medicine

## 2022-03-25 ENCOUNTER — Ambulatory Visit (HOSPITAL_COMMUNITY)
Admission: EM | Admit: 2022-03-25 | Discharge: 2022-03-25 | Disposition: A | Discharge status: Home / Self Care | Payer: BC Managed Care – PPO | Attending: Physician Assistant | Admitting: Physician Assistant

## 2022-03-25 DIAGNOSIS — J0100 Acute maxillary sinusitis, unspecified: Secondary | ICD-10-CM

## 2022-03-25 MED ORDER — AMOXICILLIN-POT CLAVULANATE 875-125 MG PO TABS: 1.0000 | ORAL_TABLET | Freq: Two times a day (BID) | ORAL | 0 refills | Status: DC

## 2022-03-25 NOTE — ED Triage Notes (Signed)
For week having nasal congestion. Reports after blowing nose has head pressure. Taken Mucinex and Advil-sinus to help with symptoms. Denies ear, throat pain or cough.

## 2022-03-25 NOTE — ED Provider Notes (Signed)
Calvin Larsen    CSN: [id number] Arrival date & time: 03/25/22  1003      History   Chief Complaint Chief Complaint  Patient presents with   Nasal Congestion    HPI Calvin Larsen is a 31 y.o. male.   Patient here today for evaluation of nasal congestion and sinus pressure he has had for the last week and does not seem to be improving. He reports more sinus pressure to right maxillary sinus area with now right sided dental pain starting to develop. He has not had any fever. He denies sore throat, ear pain or cough. He has not had any nausea, vomiting or diarrhea. He has tried OTC meds for sinuses with mild relief but no resolution.   The history is provided by the patient.    Past Medical History:  Diagnosis Date   Chest pain    GERD (gastroesophageal reflux disease)    Headache    Palpitations    SOB (shortness of breath)     There are no problems to display for this patient.   Past Surgical History:  Procedure Laterality Date   NO PAST SURGERIES         Home Medications    Prior to Admission medications   Medication Sig Start Date End Date Taking? Authorizing Provider  amoxicillin-clavulanate (AUGMENTIN) 875-125 MG tablet Take 1 tablet by mouth every 12 (twelve) hours. 03/25/22  Yes Francene Finders, PA-C  methocarbamol (ROBAXIN) 500 MG tablet Take 1 tablet (500 mg total) by mouth every 8 (eight) hours as needed for muscle spasms. 12/02/19   Petrucelli, Samantha R, PA-C  naproxen (NAPROSYN) 500 MG tablet Take 1 tablet (500 mg total) by mouth 2 (two) times daily as needed for moderate pain. 12/02/19   Petrucelli, Glynda Jaeger, PA-C    Family History Family History  Problem Relation Age of Onset   Healthy Mother    Healthy Father    Healthy Sister    Healthy Brother     Social History Social History   Tobacco Use   Smoking status: Former    Packs/day: 1.00    Years: 3.00    Total pack years: 3.00    Types: Cigarettes   Smokeless tobacco: Never   Substance Use Topics   Alcohol use: Yes    Alcohol/week: 0.0 standard drinks of alcohol    Comment: occasionally   Drug use: No     Allergies   Patient has no known allergies.   Review of Systems Review of Systems  Constitutional:  Negative for chills and fever.  HENT:  Positive for congestion and sinus pressure. Negative for ear pain and sore throat.   Eyes:  Negative for discharge and redness.  Respiratory:  Negative for cough and shortness of breath.   Gastrointestinal:  Negative for abdominal pain, diarrhea, nausea and vomiting.     Physical Exam Triage Vital Signs ED Triage Vitals  Enc Vitals Group     BP      Pulse      Resp      Temp      Temp src      SpO2      Weight      Height      Head Circumference      Peak Flow      Pain Score      Pain Loc      Pain Edu?      Excl. in [place]?    No  data found.  Updated Vital Signs BP 117/79 (BP Location: Left Arm)   Pulse (!) 56   Temp 98.2 F (36.8 C) (Oral)   Resp 14   SpO2 99%      Physical Exam Vitals and nursing note reviewed.  Constitutional:      General: He is not in acute distress.    Appearance: Normal appearance. He is not ill-appearing.  HENT:     Head: Normocephalic and atraumatic.     Right Ear: Tympanic membrane normal.     Left Ear: Tympanic membrane normal.     Nose: Congestion present.     Mouth/Throat:     Mouth: Mucous membranes are moist.     Pharynx: Oropharynx is clear. No oropharyngeal exudate or posterior oropharyngeal erythema.  Eyes:     Conjunctiva/sclera: Conjunctivae normal.  Cardiovascular:     Rate and Rhythm: Normal rate and regular rhythm.     Heart sounds: Normal heart sounds. No murmur heard. Pulmonary:     Effort: Pulmonary effort is normal. No respiratory distress.     Breath sounds: Normal breath sounds. No wheezing, rhonchi or rales.  Skin:    General: Skin is warm and dry.  Neurological:     Mental Status: He is alert.  Psychiatric:        Mood and  Affect: Mood normal.        Thought Content: Thought content normal.      UC Treatments / Results  Labs (all labs ordered are listed, but only abnormal results are displayed) Labs Reviewed - No data to display  EKG   Radiology No results found.  Procedures Procedures (including critical care time)  Medications Ordered in UC Medications - No data to display  Initial Impression / Assessment and Plan / UC Course  I have reviewed the triage vital signs and the nursing notes.  Pertinent labs & imaging results that were available during my care of the patient were reviewed by me and considered in my medical decision making (see chart for details).    Suspect sinusitis. Will treat with augmentin and encouraged symptomatic treatment otherwise. Recommend follow up with any further concerns.   Final Clinical Impressions(s) / UC Diagnoses   Final diagnoses:  Acute maxillary sinusitis, recurrence not specified   Discharge Instructions   None    ED Prescriptions     Medication Sig Dispense Auth. Provider   amoxicillin-clavulanate (AUGMENTIN) 875-125 MG tablet Take 1 tablet by mouth every 12 (twelve) hours. 14 tablet Tomi Bamberger, PA-C      PDMP not reviewed this encounter.   Tomi Bamberger, PA-C 03/25/22 1152

## 2022-04-28 ENCOUNTER — Ambulatory Visit (HOSPITAL_COMMUNITY)
Admission: EM | Admit: 2022-04-28 | Discharge: 2022-04-28 | Disposition: A | Discharge status: Home / Self Care | Payer: BC Managed Care – PPO | Attending: Emergency Medicine | Admitting: Emergency Medicine

## 2022-04-28 ENCOUNTER — Encounter (HOSPITAL_COMMUNITY): Payer: Self-pay

## 2022-04-28 DIAGNOSIS — Z1152 Encounter for screening for COVID-19: Secondary | ICD-10-CM | POA: Diagnosis not present

## 2022-04-28 DIAGNOSIS — J111 Influenza due to unidentified influenza virus with other respiratory manifestations: Secondary | ICD-10-CM | POA: Insufficient documentation

## 2022-04-28 LAB — RESP PANEL BY RT-PCR (FLU A&B, COVID) ARPGX2
Influenza A by PCR: NEGATIVE
Influenza B by PCR: NEGATIVE
SARS Coronavirus 2 by RT PCR: NEGATIVE

## 2022-04-28 MED ORDER — OSELTAMIVIR PHOSPHATE 75 MG PO CAPS: 75.0000 mg | ORAL_CAPSULE | Freq: Two times a day (BID) | ORAL | 0 refills | Status: DC

## 2022-04-28 MED ORDER — PREDNISONE 10 MG (21) PO TBPK: ORAL_TABLET | Freq: Every day | ORAL | 0 refills | Status: DC

## 2022-04-28 NOTE — ED Triage Notes (Signed)
Pt states fever,body aches,chills,headache for the past 3 days states his daughter just got over the flu. Has been taking Dayquil at home.

## 2022-04-28 NOTE — Discharge Instructions (Addendum)
Discussed with patient that we will treat symptomatically Will send off a COVID and flu the results will show an MyChart If symptoms come worse patient should be seen in the emergency room Should take Tylenol and Motrin as needed for pain or fever Can continue to take DayQuil as needed Stay hydrated drink plenty of water

## 2022-04-28 NOTE — ED Provider Notes (Signed)
[facility]    CSN: [id number] Arrival date & time: 04/28/22  0807      History   Chief Complaint Chief Complaint  Patient presents with   Fever    HPI Calvin Larsen is a 31 y.o. male.   Patient presents today with headache chills fever for the past 3 days.  States that a family member has been ill at home with the flu and he has similar symptoms.  He has been taking DayQuil with minimal relief.  Has been eating and drinking without difficulty.    Past Medical History:  Diagnosis Date   Chest pain    GERD (gastroesophageal reflux disease)    Headache    Palpitations    SOB (shortness of breath)     There are no problems to display for this patient.   Past Surgical History:  Procedure Laterality Date   NO PAST SURGERIES         Home Medications    Prior to Admission medications   Medication Sig Start Date End Date Taking? Authorizing Provider  oseltamivir (TAMIFLU) 75 MG capsule Take 1 capsule (75 mg total) by mouth every 12 (twelve) hours. 04/28/22  Yes Coralyn Mark, NP  predniSONE (STERAPRED UNI-PAK 21 TAB) 10 MG (21) TBPK tablet Take by mouth daily. Take 6 tabs by mouth daily  for 2 days, then 5 tabs for 2 days, then 4 tabs for 2 days, then 3 tabs for 2 days, 2 tabs for 2 days, then 1 tab by mouth daily for 2 days 04/28/22  Yes Coralyn Mark, NP  amoxicillin-clavulanate (AUGMENTIN) 875-125 MG tablet Take 1 tablet by mouth every 12 (twelve) hours. 03/25/22   Tomi Bamberger, PA-C  methocarbamol (ROBAXIN) 500 MG tablet Take 1 tablet (500 mg total) by mouth every 8 (eight) hours as needed for muscle spasms. 12/02/19   Petrucelli, Samantha R, PA-C  naproxen (NAPROSYN) 500 MG tablet Take 1 tablet (500 mg total) by mouth 2 (two) times daily as needed for moderate pain. 12/02/19   Petrucelli, Pleas Koch, PA-C    Family History Family History  Problem Relation Age of Onset   Healthy Mother    Healthy Father    Healthy Sister    Healthy Brother      Social History Social History   Tobacco Use   Smoking status: Former    Packs/day: 1.00    Years: 3.00    Total pack years: 3.00    Types: Cigarettes   Smokeless tobacco: Never  Substance Use Topics   Alcohol use: Yes    Alcohol/week: 0.0 standard drinks of alcohol    Comment: occasionally   Drug use: No     Allergies   Patient has no known allergies.   Review of Systems Review of Systems  Constitutional:  Positive for chills and fever.  HENT:  Positive for congestion, sinus pressure, sinus pain and sore throat.   Eyes: Negative.   Respiratory: Negative.    Cardiovascular: Negative.   Gastrointestinal: Negative.   Genitourinary: Negative.   Neurological:  Positive for headaches.     Physical Exam Triage Vital Signs ED Triage Vitals  Enc Vitals Group     BP 04/28/22 0832 111/69     Pulse Rate 04/28/22 0832 67     Resp 04/28/22 0832 16     Temp 04/28/22 0832 98.4 F (36.9 C)     Temp Source 04/28/22 0832 Oral     SpO2 04/28/22 0832 96 %  Weight --      Height --      Head Circumference --      Peak Flow --      Pain Score 04/28/22 0834 0     Pain Loc --      Pain Edu? --      Excl. in [place]? --    No data found.  Updated Vital Signs BP 111/69 (BP Location: Right Arm)   Pulse 67   Temp 98.4 F (36.9 C) (Oral)   Resp 16   SpO2 96%   Visual Acuity Right Eye Distance:   Left Eye Distance:   Bilateral Distance:    Right Eye Near:   Left Eye Near:    Bilateral Near:     Physical Exam Constitutional:      Appearance: Normal appearance.  HENT:     Right Ear: Tympanic membrane normal.     Left Ear: Tympanic membrane normal.     Nose: Congestion present.     Mouth/Throat:     Pharynx: Posterior oropharyngeal erythema present.  Eyes:     Pupils: Pupils are equal, round, and reactive to light.  Cardiovascular:     Rate and Rhythm: Normal rate.  Pulmonary:     Effort: Pulmonary effort is normal.  Abdominal:     General: Abdomen is  flat.  Musculoskeletal:        General: Normal range of motion.     Cervical back: Normal range of motion.  Skin:    Comments: Hot to touch  Neurological:     Mental Status: He is alert.      UC Treatments / Results  Labs (all labs ordered are listed, but only abnormal results are displayed) Labs Reviewed  RESP PANEL BY RT-PCR (FLU A&B, COVID) [id number]    EKG   Radiology No results found.  Procedures Procedures (including critical care time)  Medications Ordered in UC Medications - No data to display  Initial Impression / Assessment and Plan / UC Course  I have reviewed the triage vital signs and the nursing notes.  Pertinent labs & imaging results that were available during my care of the patient were reviewed by me and considered in my medical decision making (see chart for details).     Discussed with patient that we will treat symptomatically Will send off a COVID and flu the results will show an MyChart If symptoms come worse patient should be seen in the emergency room Should take Tylenol and Motrin as needed for pain or fever Can continue to take DayQuil as needed Stay hydrated drink plenty of water Final Clinical Impressions(s) / UC Diagnoses   Final diagnoses:  Influenza-like illness     Discharge Instructions      Discussed with patient that we will treat symptomatically Will send off a COVID and flu the results will show an MyChart If symptoms come worse patient should be seen in the emergency room Should take Tylenol and Motrin as needed for pain or fever Can continue to take DayQuil as needed Stay hydrated drink plenty of water     ED Prescriptions     Medication Sig Dispense Auth. Provider   predniSONE (STERAPRED UNI-PAK 21 TAB) 10 MG (21) TBPK tablet Take by mouth daily. Take 6 tabs by mouth daily  for 2 days, then 5 tabs for 2 days, then 4 tabs for 2 days, then 3 tabs for 2 days, 2 tabs for 2 days, then 1 tab by mouth daily  for 2 days  42 tablet Maple Mirza L, NP   oseltamivir (TAMIFLU) 75 MG capsule Take 1 capsule (75 mg total) by mouth every 12 (twelve) hours. 10 capsule Coralyn Mark, NP      PDMP not reviewed this encounter.   Coralyn Mark, NP 04/28/22 [phone]

## 2022-06-23 DIAGNOSIS — R070 Pain in throat: Secondary | ICD-10-CM | POA: Diagnosis not present

## 2022-06-23 DIAGNOSIS — R0981 Nasal congestion: Secondary | ICD-10-CM | POA: Diagnosis not present

## 2022-06-23 DIAGNOSIS — J069 Acute upper respiratory infection, unspecified: Secondary | ICD-10-CM | POA: Diagnosis not present

## 2022-07-06 ENCOUNTER — Ambulatory Visit: Payer: BC Managed Care – PPO | Attending: Internal Medicine | Admitting: Internal Medicine

## 2022-07-06 ENCOUNTER — Encounter: Payer: Self-pay | Admitting: Internal Medicine

## 2022-07-06 VITALS — BP 110/68 | HR 55 | Ht 67.0 in | Wt 145.0 lb

## 2022-07-06 DIAGNOSIS — R9431 Abnormal electrocardiogram [ECG] [EKG]: Secondary | ICD-10-CM

## 2022-07-06 NOTE — Progress Notes (Signed)
Cardiology Office Note:    Date:  07/06/2022   ID:  Calvin Larsen, DOB August 20, 1990, MRN [id number]  PCP:  Patient, No Pcp Per   [facility] Cardiologist:  Janina Mayo, MD     Referring MD: No ref. provider found   No chief complaint on file. Establish Care  History of Present Illness:    Calvin Larsen is a 32 y.o. male with a hx of GERD, he has no PCP and frequent ED visits.He is referred to cardiology for CP/SOB. He saw Dr. Johnsie Cancel in 2019 for CP and dyspnea/palpitations. His symptoms were atypical. His palpitations were related to etoh and coffee. He had a normal ET 12/28/2017.  He denies angina, dyspnea on exertion, lower extremity edema, PND or orthopnea. No palpitations. No syncope. No family hx of SCD.   Past Medical History:  Diagnosis Date   Chest pain    GERD (gastroesophageal reflux disease)    Headache    Palpitations    SOB (shortness of breath)     Past Surgical History:  Procedure Laterality Date   NO PAST SURGERIES      Current Medications: Current Outpatient Medications on File Prior to Visit  Medication Sig Dispense Refill   amoxicillin-clavulanate (AUGMENTIN) 875-125 MG tablet Take 1 tablet by mouth every 12 (twelve) hours. (Patient not taking: Reported on 07/06/2022) 14 tablet 0   methocarbamol (ROBAXIN) 500 MG tablet Take 1 tablet (500 mg total) by mouth every 8 (eight) hours as needed for muscle spasms. (Patient not taking: Reported on 07/06/2022) 15 tablet 0   naproxen (NAPROSYN) 500 MG tablet Take 1 tablet (500 mg total) by mouth 2 (two) times daily as needed for moderate pain. (Patient not taking: Reported on 07/06/2022) 15 tablet 0   oseltamivir (TAMIFLU) 75 MG capsule Take 1 capsule (75 mg total) by mouth every 12 (twelve) hours. (Patient not taking: Reported on 07/06/2022) 10 capsule 0   predniSONE (STERAPRED UNI-PAK 21 TAB) 10 MG (21) TBPK tablet Take by mouth daily. Take 6 tabs by mouth daily  for 2 days, then 5 tabs for 2 days,  then 4 tabs for 2 days, then 3 tabs for 2 days, 2 tabs for 2 days, then 1 tab by mouth daily for 2 days (Patient not taking: Reported on 07/06/2022) 42 tablet 0   No current facility-administered medications on file prior to visit.     Allergies:   Patient has no known allergies.   Social History   Socioeconomic History   Marital status: Married    Spouse name: Not on file   Number of children: Not on file   Years of education: Not on file   Highest education level: Not on file  Occupational History   Not on file  Tobacco Use   Smoking status: Former    Packs/day: 1.00    Years: 3.00    Total pack years: 3.00    Types: Cigarettes   Smokeless tobacco: Never  Substance and Sexual Activity   Alcohol use: Yes    Alcohol/week: 0.0 standard drinks of alcohol    Comment: occasionally   Drug use: No   Sexual activity: Yes    Birth control/protection: Condom  Other Topics Concern   Not on file  Social History Narrative   Not on file   Social Determinants of Health   Financial Resource Strain: Not on file  Food Insecurity: Not on file  Transportation Needs: Not on file  Physical Activity: Not on file  Stress: Not on file  Social Connections: Not on file     Family History: The patient's family history includes Healthy in his brother, father, mother, and sister.  ROS:   Please see the history of present illness.     All other systems reviewed and are negative.  EKGs/Labs/Other Studies Reviewed:    The following studies were reviewed today:   EKG:  EKG is  ordered today.  The ekg ordered today demonstrates   07/06/2022- NSR, LVH, right axis (unchanged from prior)  Recent Labs: No results found for requested labs within last 365 days.  Recent Lipid Panel No results found for: "CHOL", "TRIG", "HDL", "CHOLHDL", "VLDL", "LDLCALC", "LDLDIRECT"   Risk Assessment/Calculations:     Physical Exam:    VS:  BP 110/68 (BP Location: Left Arm, Patient Position: Sitting,  Cuff Size: Normal)   Pulse (!) 55   Ht 5' 7"$  (1.702 m)   Wt 145 lb (65.8 kg)   SpO2 99%   BMI 22.71 kg/m     Wt Readings from Last 3 Encounters:  07/06/22 145 lb (65.8 kg)  12/01/19 147 lb (66.7 kg)  12/26/17 143 lb 12 oz (65.2 kg)     GEN:  Well nourished, well developed in no acute distress HEENT: Normal NECK: No JVD; No carotid bruits LYMPHATICS: No lymphadenopathy CARDIAC: RRR, no murmurs, rubs, gallops RESPIRATORY:  Clear to auscultation without rales, wheezing or rhonchi  ABDOMEN: Soft, non-tender, non-distended MUSCULOSKELETAL:  No edema; No deformity  SKIN: Warm and dry NEUROLOGIC:  Alert and oriented x 3 PSYCHIATRIC:  Normal affect   ASSESSMENT:    LVH: Has LVH/right axis on EKG, if going to [facility] can get a TTE to be thorough. Can be a normal variant. No murmurs to suggest HOCM. He had prior ETT that was normal in 2019. The pretest probability is very low for coronary disease for him.  PLAN:    In order of problems listed above:  Limited TTE, EF; rule out HCM Follow up PRN      Medication Adjustments/Labs and Tests Ordered: Current medicines are reviewed at length with the patient today.  Concerns regarding medicines are outlined above.  Orders Placed This Encounter  Procedures   ECHOCARDIOGRAM LIMITED   No orders of the defined types were placed in this encounter.   Patient Instructions  Medication Instructions:    No changes    Lab Work: Not needed    Testing/Procedures:  Will be schedule at [place] has requested that you have an  limited echocardiogram. Echocardiography is a painless test that uses sound waves to create images of your heart. It provides your doctor with information about the size and shape of your heart and how well your heart's chambers and valves are working. This procedure takes approximately one hour. There are no restrictions for this procedure. Please do NOT wear  cologne, perfume, aftershave, or lotions (deodorant is allowed). Please arrive 15 minutes prior to your appointment time.   Follow-Up: At [facility], you and your health needs are our priority.  As part of our continuing mission to provide you with exceptional heart care, we have created designated Provider Care Teams.  These Care Teams include your primary Cardiologist (physician) and Advanced Practice Providers (APPs -  Physician Assistants and Nurse Practitioners) who all work together to provide you with the care you need, when you need it.     Your next appointment:   As needed  if test is normal    The format for your next appointment:   In Person  Provider:   Janina Mayo, MD     Signed, Janina Mayo, MD  07/06/2022 3:36 PM    [place]

## 2022-07-06 NOTE — Patient Instructions (Signed)
Medication Instructions:    No changes    Lab Work: Not needed    Testing/Procedures:  Will be schedule at [facility] has requested that you have an  limited echocardiogram. Echocardiography is a painless test that uses sound waves to create images of your heart. It provides your doctor with information about the size and shape of your heart and how well your heart's chambers and valves are working. This procedure takes approximately one hour. There are no restrictions for this procedure. Please do NOT wear cologne, perfume, aftershave, or lotions (deodorant is allowed). Please arrive 15 minutes prior to your appointment time.   Follow-Up: At [facility], you and your health needs are our priority.  As part of our continuing mission to provide you with exceptional heart care, we have created designated Provider Care Teams.  These Care Teams include your primary Cardiologist (physician) and Advanced Practice Providers (APPs -  Physician Assistants and Nurse Practitioners) who all work together to provide you with the care you need, when you need it.     Your next appointment:   As needed if test is normal    The format for your next appointment:   In Person  Provider:   Janina Mayo, MD

## 2022-07-08 NOTE — Addendum Note (Signed)
Addended by: Rexanne Mano B on: 07/08/2022 11:40 AM   Modules accepted: Orders

## 2022-07-28 ENCOUNTER — Telehealth: Payer: Self-pay | Admitting: Internal Medicine

## 2022-07-28 NOTE — Telephone Encounter (Signed)
  Pt is requesting to get a "conclusion letter" he said, it should state, the reason why ED referred him to Cardiologist because they couldn't see anything and he needed further evaluation and then Dr. Harl Bowie cleared him that he can join the [employer]. He said, he can pick it up at the office when its ready.

## 2022-07-28 NOTE — Telephone Encounter (Signed)
Informed pt that Dr Harl Bowie is in the hospital until Tuesday.

## 2022-08-02 NOTE — Telephone Encounter (Signed)
Called pt. She states he needs a conclusion/clearance letter for the [employer]. He states they do not understanding why he was referred to the cardiologist in the first place. "I have submitted the office notes but this is not giving the conclusion as to why I was going to the ED." Pt has an echo Thursday. He has an email from [employer] and will print it off and bring by the office to have the questions addressed.

## 2022-08-02 NOTE — Telephone Encounter (Signed)
Patient calling back.   °

## 2022-08-04 ENCOUNTER — Other Ambulatory Visit (HOSPITAL_COMMUNITY): Payer: BC Managed Care – PPO

## 2022-08-04 ENCOUNTER — Ambulatory Visit (HOSPITAL_COMMUNITY): Payer: BC Managed Care – PPO | Attending: Cardiovascular Disease

## 2022-08-04 ENCOUNTER — Telehealth: Payer: Self-pay | Admitting: Internal Medicine

## 2022-08-04 DIAGNOSIS — R9431 Abnormal electrocardiogram [ECG] [EKG]: Secondary | ICD-10-CM | POA: Insufficient documentation

## 2022-08-04 LAB — ECHOCARDIOGRAM LIMITED
Area-P 1/2: 3.77 cm2
S' Lateral: 2.5 cm

## 2022-08-04 NOTE — Telephone Encounter (Signed)
Paper Work Dropped Off: Education administrator letter   Date: 08/04/2022  Location of paper: handed to covering RN for Dr.Branch today  (08/04/2022)

## 2022-08-04 NOTE — Telephone Encounter (Signed)
Echo completed today-pending read.

## 2022-08-10 NOTE — Telephone Encounter (Signed)
Letter printed and signed by Dr. Harl Bowie- left at check out for patient to pick up.   Attempted to call patient, left message for patient to call back to office.

## 2022-08-10 NOTE — Telephone Encounter (Signed)
Forms that were left at office to be filled out states that patient needs clearance from cardiology work up about the Syncope/palpitations due to patient being seen in the ER several times for this complaint.   No form to sign- just need to provide letter from Dr. Bea Graff testing.   Will forward to MD for her to review.

## 2022-08-10 NOTE — Telephone Encounter (Signed)
Calvin Mayo, MD  You14 minutes ago (3:10 PM)   MB To clarify, I need to write a letter clearing him? Do you mind drafting and I can sign. It can state that he has had a cardiac work up including an echocardiogram and a stress test which were normal. He has cardiac clearance for the [employer].   Letter printed and signed by Dr. Harl Bowie

## 2022-12-27 DIAGNOSIS — M25512 Pain in left shoulder: Secondary | ICD-10-CM | POA: Diagnosis not present

## 2022-12-27 DIAGNOSIS — G8929 Other chronic pain: Secondary | ICD-10-CM | POA: Diagnosis not present

## 2023-01-24 DIAGNOSIS — R7309 Other abnormal glucose: Secondary | ICD-10-CM | POA: Diagnosis not present

## 2023-01-24 DIAGNOSIS — Z79899 Other long term (current) drug therapy: Secondary | ICD-10-CM | POA: Diagnosis not present

## 2023-01-24 DIAGNOSIS — Z0000 Encounter for general adult medical examination without abnormal findings: Secondary | ICD-10-CM | POA: Diagnosis not present

## 2023-10-25 DIAGNOSIS — Z872 Personal history of diseases of the skin and subcutaneous tissue: Secondary | ICD-10-CM | POA: Diagnosis not present

## 2023-11-06 ENCOUNTER — Encounter (HOSPITAL_COMMUNITY): Payer: Self-pay | Admitting: Emergency Medicine

## 2023-11-06 ENCOUNTER — Ambulatory Visit (HOSPITAL_COMMUNITY): Admission: EM | Admit: 2023-11-06 | Discharge: 2023-11-06 | Disposition: A | Discharge status: Home / Self Care

## 2023-11-06 DIAGNOSIS — T148XXA Other injury of unspecified body region, initial encounter: Secondary | ICD-10-CM

## 2023-11-06 DIAGNOSIS — M545 Low back pain, unspecified: Secondary | ICD-10-CM

## 2023-11-06 MED ORDER — KETOROLAC TROMETHAMINE 30 MG/ML IJ SOLN
30.0000 mg | Freq: Once | INTRAMUSCULAR | Status: AC
  Administered 2023-11-06: 30 mg via INTRAMUSCULAR

## 2023-11-06 MED ORDER — KETOROLAC TROMETHAMINE 30 MG/ML IJ SOLN
INTRAMUSCULAR | Status: AC
  Filled 2023-11-06: qty 1

## 2023-11-06 NOTE — ED Provider Notes (Signed)
 [facility]    CSN: [id number] Arrival date & time: 11/06/23  1413      History   Chief Complaint Chief Complaint  Patient presents with   Back Pain    HPI Calvin Larsen is a 33 y.o. male.   Patient is a 33 year old male who presents to the urgent care today with concerns of left-sided lower back pain.  He reports that earlier today when he was working out doing rows, he felt a popping and straining sensation in his lower back.  Since then, the pain has continued to hurt.  He has not taken anything or tried anything for the pain.  He denies any other symptoms including fever, urinary retention/incontinence, loss of sensation, decreased range of motion, or other concerns at this time.    Past Medical History:  Diagnosis Date   Chest pain    GERD (gastroesophageal reflux disease)    Headache    Palpitations    SOB (shortness of breath)     There are no active problems to display for this patient.   Past Surgical History:  Procedure Laterality Date   NO PAST SURGERIES         Home Medications    Prior to Admission medications   Medication Sig Start Date End Date Taking? Authorizing Provider  amoxicillin -clavulanate (AUGMENTIN ) 875-125 MG tablet Take 1 tablet by mouth every 12 (twelve) hours. Patient not taking: Reported on 07/06/2022 03/25/22   Vernestine Gondola, PA-C  methocarbamol  (ROBAXIN ) 500 MG tablet Take 1 tablet (500 mg total) by mouth every 8 (eight) hours as needed for muscle spasms. Patient not taking: Reported on 07/06/2022 12/02/19   Petrucelli, Samantha R, PA-C  naproxen  (NAPROSYN ) 500 MG tablet Take 1 tablet (500 mg total) by mouth 2 (two) times daily as needed for moderate pain. Patient not taking: Reported on 07/06/2022 12/02/19   Petrucelli, Samantha R, PA-C  oseltamivir  (TAMIFLU ) 75 MG capsule Take 1 capsule (75 mg total) by mouth every 12 (twelve) hours. Patient not taking: Reported on 07/06/2022 04/28/22   Harden Leyden, NP  predniSONE   (STERAPRED UNI-PAK 21 TAB) 10 MG (21) TBPK tablet Take by mouth daily. Take 6 tabs by mouth daily  for 2 days, then 5 tabs for 2 days, then 4 tabs for 2 days, then 3 tabs for 2 days, 2 tabs for 2 days, then 1 tab by mouth daily for 2 days Patient not taking: Reported on 07/06/2022 04/28/22   Harden Leyden, NP    Family History Family History  Problem Relation Age of Onset   Healthy Mother    Healthy Father    Healthy Sister    Healthy Brother     Social History Social History   Tobacco Use   Smoking status: Former    Current packs/day: 1.00    Average packs/day: 1 pack/day for 3.0 years (3.0 ttl pk-yrs)    Types: Cigarettes   Smokeless tobacco: Never  Substance Use Topics   Alcohol use: Yes    Alcohol/week: 0.0 standard drinks of alcohol    Comment: occasionally   Drug use: No     Allergies   Patient has no known allergies.   Review of Systems Review of Systems See HPI for relevant ROS.  Physical Exam Triage Vital Signs ED Triage Vitals  Encounter Vitals Group     BP 11/06/23 1513 112/68     Girls Systolic BP Percentile --      Girls Diastolic BP Percentile --  Boys Systolic BP Percentile --      Boys Diastolic BP Percentile --      Pulse Rate 11/06/23 1513 (!) 55     Resp 11/06/23 1513 14     Temp 11/06/23 1513 98 F (36.7 C)     Temp Source 11/06/23 1513 Oral     SpO2 11/06/23 1513 98 %     Weight --      Height --      Head Circumference --      Peak Flow --      Pain Score 11/06/23 1512 7     Pain Loc --      Pain Education --      Exclude from Growth Chart --    No data found.  Updated Vital Signs BP 112/68 (BP Location: Right Arm)   Pulse (!) 55   Temp 98 F (36.7 C) (Oral)   Resp 14   SpO2 98%   Visual Acuity Right Eye Distance:   Left Eye Distance:   Bilateral Distance:    Right Eye Near:   Left Eye Near:    Bilateral Near:     Physical Exam General: Alert and oriented, well-developed/well-nourished, calm, cooperative,  no acute distress HEENT: Normocephalic atraumatic, moist mucous membranes, no scleral icterus, trachea midline Lungs: Speaking full sentences, non-labored respirations, no distress Heart: Regular rate and rhythm Abdomen:  Soft, nondistended Musculoskeletal: Moves all extremities well, mild tenderness to the lower left back, full and equal strength bilaterally in the lower extremities Back: No pain to palpation of the spine Pulses: 2+ pedal bilaterally Neurologic: Awake, A&O x4, gait normal, full sensation to light touch in the lower extremities Integumentary: Warm, dry, normal for ethnicity, intact, no rash Psychiatric: Appropriate mood & affect  UC Treatments / Results  Labs (all labs ordered are listed, but only abnormal results are displayed) Labs Reviewed - No data to display  EKG   Radiology No results found.  Procedures Procedures (including critical care time)  Medications Ordered in UC Medications  ketorolac (TORADOL) 30 MG/ML injection 30 mg (30 mg Intramuscular Given 11/06/23 1605)    Initial Impression / Assessment and Plan / UC Course  I have reviewed the triage vital signs and the nursing notes.  Pertinent labs & imaging results that were available during my care of the patient were reviewed by me and considered in my medical decision making (see chart for details).    Presents with low back pain.  Differential diagnosis includes: Strain, sprain, disc herniation, spondylolisthesis, cauda equina, spinal abscess, spinal stenosis, pyelonephritis, nephrolithiasis, AAA, meningitis, malignancy, including other diagnoses.   History obtained from: Patient.  Plan: Presents with back pain. Less likely sciatica as straight leg raise test was negative. No back pain red flags on history or physical. Presentation not consistent with malignancy (lack of history of malignancy, lack of B symptoms), fracture (no trauma, no bony tenderness to palpation), cauda equina (no bowel or  urinary incontinence/retention, no saddle anesthesia, no distal weakness), AAA, viscus perforation, osteomyelitis or epidural abscess (no IVDU, vertebral tenderness), renal colic, pyelonephritis (afebrile, no CVAT, no urinary symptoms). Given the clinical picture, no indication for imaging at this time.  Patient was given a dose of ketorolac in the clinic to help with pain.  Starting tomorrow, patient can take ibuprofen  and Tylenol as needed for pain relief.  Patient should follow-up with their primary care provider in the next several days.  Strict return precautions discussed including neurologic symptoms, fever, worsening of  symptoms, loss of sensation, urinary incontinence/retention, distal weakness, or if patient has any other concerns.  Disposition: Stable to discharge home.  All questions answered to the best of this examiner's ability. Advised to f/u with PCP for further eval and/or reassessment. Patient agrees to plan.  An appropriate evaluation has been performed, and in my medical judgment there is currently no evidence of an immediate life-threatening or surgical condition. Discharge is therefore indicated at this time.  This document was created using the aid of voice recognition Scientist, clinical (histocompatibility and immunogenetics).  Final Clinical Impressions(s) / UC Diagnoses   Final diagnoses:  Acute left-sided low back pain without sciatica  Muscle strain     Discharge Instructions      You can take Tylenol when you get home.  Starting tomorrow, you can take Tylenol and ibuprofen  as needed for pain relief.  You can try ice, heat, and topical creams to help with the pain.  We recommend following up with your primary care provider if symptoms are not improving over the next week.  Additionally, you may want to follow-up with physical therapy for further management of your low back pain.  Please return or go to the emergency department if you have any urinary retention/incontinence, loss of sensation in the  legs, weakness, pain out of proportion, or if you have any other concerns.    ED Prescriptions   None    PDMP not reviewed this encounter.   Edna Gouty, PA-C 11/06/23 1609

## 2023-11-06 NOTE — Discharge Instructions (Signed)
 You can take Tylenol when you get home.  Starting tomorrow, you can take Tylenol and ibuprofen  as needed for pain relief.  You can try ice, heat, and topical creams to help with the pain.  We recommend following up with your primary care provider if symptoms are not improving over the next week.  Additionally, you may want to follow-up with physical therapy for further management of your low back pain.  Please return or go to the emergency department if you have any urinary retention/incontinence, loss of sensation in the legs, weakness, pain out of proportion, or if you have any other concerns.

## 2023-11-06 NOTE — ED Triage Notes (Signed)
 Pt was working out at the gym this morning when felt pop in back and had pain since. Hasn't tried any measures to help with pain

## 2024-02-02 DIAGNOSIS — Z79899 Other long term (current) drug therapy: Secondary | ICD-10-CM | POA: Diagnosis not present

## 2024-02-02 DIAGNOSIS — Z0000 Encounter for general adult medical examination without abnormal findings: Secondary | ICD-10-CM | POA: Diagnosis not present

## 2024-02-02 DIAGNOSIS — R7309 Other abnormal glucose: Secondary | ICD-10-CM | POA: Diagnosis not present

## 2024-02-02 DIAGNOSIS — Z13220 Encounter for screening for lipoid disorders: Secondary | ICD-10-CM | POA: Diagnosis not present

## 2024-02-02 DIAGNOSIS — Z23 Encounter for immunization: Secondary | ICD-10-CM | POA: Diagnosis not present

## 2024-03-07 DIAGNOSIS — H04123 Dry eye syndrome of bilateral lacrimal glands: Secondary | ICD-10-CM | POA: Diagnosis not present

## 2024-03-07 DIAGNOSIS — G514 Facial myokymia: Secondary | ICD-10-CM | POA: Diagnosis not present

## 2024-03-07 DIAGNOSIS — H02403 Unspecified ptosis of bilateral eyelids: Secondary | ICD-10-CM | POA: Diagnosis not present

## 2024-03-29 ENCOUNTER — Ambulatory Visit (HOSPITAL_COMMUNITY)
Admission: EM | Admit: 2024-03-29 | Discharge: 2024-03-29 | Disposition: A | Discharge status: Home / Self Care | Attending: Emergency Medicine | Admitting: Emergency Medicine

## 2024-03-29 ENCOUNTER — Encounter (HOSPITAL_COMMUNITY): Payer: Self-pay

## 2024-03-29 DIAGNOSIS — Z113 Encounter for screening for infections with a predominantly sexual mode of transmission: Secondary | ICD-10-CM | POA: Insufficient documentation

## 2024-03-29 LAB — HIV ANTIBODY (ROUTINE TESTING W REFLEX): HIV Screen 4th Generation wRfx: NONREACTIVE

## 2024-03-29 NOTE — Discharge Instructions (Signed)
 Your results will come back over the next few days and someone will call if results are positive and require treatment.  Return here as needed.

## 2024-03-29 NOTE — ED Provider Notes (Signed)
 [facility]    CSN: [id number] Arrival date & time: 03/29/24  9074      History   Chief Complaint Chief Complaint  Patient presents with   SEXUALLY TRANSMITTED DISEASE    HPI [name] is a 33 y.o. male.   Patient presents requesting STD testing.  Patient denies any known exposures to STDs.  Patient also denies any penile discharge, penile/testicular pain or swelling, penile lesions, dysuria, hematuria, urinary frequency/urgency, and abdominal pain.  The history is provided by the patient and medical records.    Past Medical History:  Diagnosis Date   Chest pain    GERD (gastroesophageal reflux disease)    Headache    Palpitations    SOB (shortness of breath)     There are no active problems to display for this patient.   Past Surgical History:  Procedure Laterality Date   NO PAST SURGERIES         Home Medications    Prior to Admission medications   Not on File    Family History Family History  Problem Relation Age of Onset   Healthy Mother    Healthy Father    Healthy Sister    Healthy Brother     Social History Social History   Tobacco Use   Smoking status: Former    Current packs/day: 1.00    Average packs/day: 1 pack/day for 3.0 years (3.0 ttl pk-yrs)    Types: Cigarettes   Smokeless tobacco: Never  Substance Use Topics   Alcohol use: Yes    Alcohol/week: 0.0 standard drinks of alcohol    Comment: occasionally   Drug use: No     Allergies   Patient has no known allergies.   Review of Systems Review of Systems  Per HPI  Physical Exam Triage Vital Signs ED Triage Vitals [03/29/24 1040]  Encounter Vitals Group     BP 111/73     Girls Systolic BP Percentile      Girls Diastolic BP Percentile      Boys Systolic BP Percentile      Boys Diastolic BP Percentile      Pulse Rate (!) 52     Resp 16     Temp 98 F (36.7 C)     Temp Source Oral     SpO2 99 %     Weight      Height      Head Circumference       Peak Flow      Pain Score 0     Pain Loc      Pain Education      Exclude from Growth Chart    No data found.  Updated Vital Signs BP 111/73 (BP Location: Left Arm)   Pulse (!) 52   Temp 98 F (36.7 C) (Oral)   Resp 16   SpO2 99%   Visual Acuity Right Eye Distance:   Left Eye Distance:   Bilateral Distance:    Right Eye Near:   Left Eye Near:    Bilateral Near:     Physical Exam Vitals and nursing note reviewed.  Constitutional:      General: He is awake. He is not in acute distress.    Appearance: Normal appearance. He is well-developed and well-groomed. He is not ill-appearing.  Abdominal:     General: Abdomen is flat. Bowel sounds are normal.     Palpations: Abdomen is soft.     Tenderness: There is no abdominal  tenderness.  Genitourinary:    Comments: Exam deferred Skin:    General: Skin is warm and dry.  Neurological:     Mental Status: He is alert.  Psychiatric:        Behavior: Behavior is cooperative.      UC Treatments / Results  Labs (all labs ordered are listed, but only abnormal results are displayed) Labs Reviewed  HIV ANTIBODY (ROUTINE TESTING W REFLEX)  RPR  CYTOLOGY, (ORAL, ANAL, URETHRAL) ANCILLARY ONLY    EKG   Radiology No results found.  Procedures Procedures (including critical care time)  Medications Ordered in UC Medications - No data to display  Initial Impression / Assessment and Plan / UC Course  I have reviewed the triage vital signs and the nursing notes.  Pertinent labs & imaging results that were available during my care of the patient were reviewed by me and considered in my medical decision making (see chart for details).     Patient is overall well-appearing.  Vitals are stable.  GU exam deferred.  Patient performed self swab for STD.  HIV and RPR ordered.  Discussed follow-up and return precautions. Final Clinical Impressions(s) / UC Diagnoses   Final diagnoses:  Screen for STD (sexually transmitted  disease)     Discharge Instructions      Your results will come back over the next few days and someone will call if results are positive and require treatment.  Return here as needed.    ED Prescriptions   None    PDMP not reviewed this encounter.   Johnie Flaming A, NP 03/29/24 1106

## 2024-03-29 NOTE — ED Triage Notes (Signed)
 Pt states he would like to be tested for STD's including blood work. Pt denies any symptoms.

## 2024-03-30 LAB — RPR: RPR Ser Ql: NONREACTIVE

## 2024-04-01 LAB — CYTOLOGY, (ORAL, ANAL, URETHRAL) ANCILLARY ONLY
Chlamydia: NEGATIVE
Comment: NEGATIVE
Comment: NEGATIVE
Comment: NORMAL
Neisseria Gonorrhea: NEGATIVE
Trichomonas: NEGATIVE
